# Patient Record
Sex: Female | Born: 1950 | Race: White | Hispanic: No | Marital: Married | Smoking: Former smoker
Health system: Southern US, Community
[De-identification: ages and names within clinical notes are randomized; demographics above are authoritative.]

## PROBLEM LIST (undated history)

## (undated) DIAGNOSIS — E213 Hyperparathyroidism, unspecified: Secondary | ICD-10-CM

## (undated) DIAGNOSIS — C801 Malignant (primary) neoplasm, unspecified: Secondary | ICD-10-CM

## (undated) DIAGNOSIS — M1990 Unspecified osteoarthritis, unspecified site: Secondary | ICD-10-CM

## (undated) DIAGNOSIS — J189 Pneumonia, unspecified organism: Secondary | ICD-10-CM

## (undated) DIAGNOSIS — N950 Postmenopausal bleeding: Secondary | ICD-10-CM

## (undated) DIAGNOSIS — K219 Gastro-esophageal reflux disease without esophagitis: Secondary | ICD-10-CM

## (undated) DIAGNOSIS — M75100 Unspecified rotator cuff tear or rupture of unspecified shoulder, not specified as traumatic: Secondary | ICD-10-CM

## (undated) DIAGNOSIS — D649 Anemia, unspecified: Secondary | ICD-10-CM

## (undated) DIAGNOSIS — E7800 Pure hypercholesterolemia, unspecified: Secondary | ICD-10-CM

## (undated) DIAGNOSIS — I10 Essential (primary) hypertension: Secondary | ICD-10-CM

## (undated) DIAGNOSIS — E039 Hypothyroidism, unspecified: Secondary | ICD-10-CM

## (undated) DIAGNOSIS — N183 Chronic kidney disease, stage 3 unspecified: Secondary | ICD-10-CM

## (undated) HISTORY — DX: Anemia, unspecified: D64.9

## (undated) HISTORY — DX: Unspecified osteoarthritis, unspecified site: M19.90

## (undated) HISTORY — DX: Postmenopausal bleeding: N95.0

## (undated) HISTORY — DX: Gastro-esophageal reflux disease without esophagitis: K21.9

## (undated) HISTORY — PX: TUBAL LIGATION: SHX77

## (undated) HISTORY — DX: Malignant (primary) neoplasm, unspecified: C80.1

## (undated) HISTORY — PX: OTHER SURGICAL HISTORY: SHX169

## (undated) HISTORY — DX: Unspecified rotator cuff tear or rupture of unspecified shoulder, not specified as traumatic: M75.100

## (undated) HISTORY — PX: FOOT NEUROMA SURGERY: SHX646

## (undated) HISTORY — PX: DILATION AND CURETTAGE OF UTERUS: SHX78

## (undated) HISTORY — PX: BACK SURGERY: SHX140

## (undated) HISTORY — DX: Essential (primary) hypertension: I10

## (undated) HISTORY — DX: Hypothyroidism, unspecified: E03.9

---

## 2002-11-22 HISTORY — PX: LUMBAR FUSION: SHX111

## 2004-04-10 ENCOUNTER — Encounter: Admission: RE | Admit: 2004-04-10 | Discharge: 2004-04-10 | Payer: Self-pay | Admitting: Neurosurgery

## 2004-05-14 ENCOUNTER — Inpatient Hospital Stay (HOSPITAL_COMMUNITY): Admission: RE | Admit: 2004-05-14 | Discharge: 2004-05-15 | Payer: Self-pay | Admitting: Neurosurgery

## 2004-08-12 ENCOUNTER — Encounter: Admission: RE | Admit: 2004-08-12 | Discharge: 2004-08-12 | Payer: Self-pay | Admitting: Neurosurgery

## 2005-01-05 ENCOUNTER — Ambulatory Visit: Payer: Self-pay

## 2006-02-03 ENCOUNTER — Ambulatory Visit: Payer: Self-pay | Admitting: General Practice

## 2006-03-14 ENCOUNTER — Ambulatory Visit: Payer: Self-pay | Admitting: Gastroenterology

## 2010-11-11 ENCOUNTER — Ambulatory Visit: Payer: Self-pay

## 2010-12-08 ENCOUNTER — Ambulatory Visit: Payer: Self-pay | Admitting: Internal Medicine

## 2010-12-11 ENCOUNTER — Ambulatory Visit: Payer: Self-pay | Admitting: Internal Medicine

## 2011-10-19 ENCOUNTER — Ambulatory Visit: Payer: Self-pay | Admitting: Specialist

## 2011-10-27 ENCOUNTER — Encounter: Payer: Self-pay | Admitting: Specialist

## 2011-11-23 ENCOUNTER — Encounter: Payer: Self-pay | Admitting: Specialist

## 2011-12-17 ENCOUNTER — Ambulatory Visit: Payer: Self-pay | Admitting: Gastroenterology

## 2011-12-22 LAB — PATHOLOGY REPORT

## 2011-12-23 ENCOUNTER — Ambulatory Visit: Payer: Self-pay

## 2013-12-06 ENCOUNTER — Ambulatory Visit: Payer: Self-pay | Admitting: Internal Medicine

## 2014-02-13 ENCOUNTER — Ambulatory Visit: Payer: Self-pay

## 2014-03-05 ENCOUNTER — Ambulatory Visit: Payer: Self-pay | Admitting: Obstetrics and Gynecology

## 2014-03-05 LAB — COMPREHENSIVE METABOLIC PANEL
ALBUMIN: 4 g/dL (ref 3.4–5.0)
ALK PHOS: 57 U/L
AST: 19 U/L (ref 15–37)
Anion Gap: 6 — ABNORMAL LOW (ref 7–16)
BUN: 25 mg/dL — AB (ref 7–18)
Bilirubin,Total: 0.3 mg/dL (ref 0.2–1.0)
CALCIUM: 9.5 mg/dL (ref 8.5–10.1)
CREATININE: 0.91 mg/dL (ref 0.60–1.30)
Chloride: 105 mmol/L (ref 98–107)
Co2: 29 mmol/L (ref 21–32)
EGFR (African American): >60
GLUCOSE: 92 mg/dL (ref 65–99)
OSMOLALITY: 283 (ref 275–301)
Potassium: 4 mmol/L (ref 3.5–5.1)
SGPT (ALT): 18 U/L (ref 12–78)
SODIUM: 140 mmol/L (ref 136–145)
TOTAL PROTEIN: 7.8 g/dL (ref 6.4–8.2)

## 2014-03-05 LAB — CBC
HCT: 37.3 % (ref 35.0–47.0)
HGB: 12.5 g/dL (ref 12.0–16.0)
MCH: 29.2 pg (ref 26.0–34.0)
MCHC: 33.4 g/dL (ref 32.0–36.0)
MCV: 88 fL (ref 80–100)
PLATELETS: 289 10*3/uL (ref 150–440)
RBC: 4.27 10*6/uL (ref 3.80–5.20)
RDW: 13.2 % (ref 11.5–14.5)
WBC: 7.3 10*3/uL (ref 3.6–11.0)

## 2014-03-21 ENCOUNTER — Ambulatory Visit: Payer: Self-pay | Admitting: Obstetrics and Gynecology

## 2014-03-21 HISTORY — PX: DILATION AND CURETTAGE OF UTERUS: SHX78

## 2014-03-22 LAB — PATHOLOGY REPORT

## 2015-03-15 NOTE — Op Note (Signed)
PATIENT NAME:  Stephanie Clements, Stephanie Clements MR#:  [id number] DATE OF BIRTH:  October 19, 1951  DATE OF PROCEDURE:  03/21/2014  PREOPERATIVE DIAGNOSES: 1.  Postmenopausal bleeding.  2.  Suspected endometrial/endocervical polyp.  POSTOPERATIVE DIAGNOSES: 1.  Postmenopausal bleeding.  2.  Suspected endometrial/endocervical polyp.  PROCEDURES: 1.  Dilation and curettage.  2.  Hysteroscopy.  3.  Polypectomy using MyoSure.  ANESTHESIA:  General.   SURGEON: Prentice Docker, M.D.   ESTIMATED BLOOD LOSS: 25 mL.  OPERATIVE FLUIDS: 1100 mL crystalloid.   COMPLICATIONS: None.  CALCULATED FLUID DEFICIT: 120 mL.   FINDINGS: 1.  Broad-based polypoid lesion located in anterior lower uterine segment/proximal endocervix.  2.  Otherwise normal-appearing uterine cavity with atrophic-appearing endometrium.   SPECIMENS: 1.  Endometrial curetting.  2.  Fragments of polyp.  CONDITION AT END OF PROCEDURE: Stable.   PROCEDURE IN DETAIL: The patient was taken to the operating room where general anesthesia was administered and found to be adequate. She was placed in the dorsal supine high-lithotomy position in candy cane stirrups and prepped and draped in the usual sterile fashion. After timeout was called, a red rubber catheter was placed through the urethra into the bladder for an in-and-out catheterization. About 100 mL of clear urine was returned. The catheter was removed. A sterile speculum was placed in the vagina and a single-tooth tenaculum was used to grasp the anterior lip of the cervix. The cervix was dilated gently in a serial fashion using Hegar dilators to a dilatation of 6 mm. The MyoSure hysteroscope was then gently introduced through the cervix into the uterine cavity and endocervix with the above-noted findings. The medium MyoSure device was then utilized to remove the polypoid lesion in its entirety. The MyoSure device was removed and a gentle curettage was performed to take a general global sample of  the uterine lining. The hysteroscope was then gently reintroduced with no noted damage or defect to the uterus. At this point, the hysteroscopic portion of the procedure was terminated. There was a little bit of oozing from the distal cervix which is ultimately cauterized using silver nitrate and at the end of the procedure there was hemostasis. The single-tooth tenaculum was removed from the cervix and hemostasis was obtained. The speculum was removed from the vagina and all other instrumentation was assured to be removed from the vagina.   The patient tolerated the procedure well. Sponge, lap and needle counts were correct x 2. For VTE prophylaxis, she was wearing pneumatic compression stockings which were on and operating throughout the entire procedure. She was awakened in the operating room and taken to the recovery area in stable condition.   ____________________________ Will Bonnet, MD sdj:ce D: 03/21/2014 13:36:06 ET T: 03/21/2014 13:46:55 ET JOB#: [id number]  cc: Will Bonnet, MD, <Dictator> Will Bonnet MD ELECTRONICALLY SIGNED 04/02/2014 4:22

## 2019-07-13 ENCOUNTER — Other Ambulatory Visit: Payer: Self-pay | Admitting: Neurosurgery

## 2019-07-13 DIAGNOSIS — M5416 Radiculopathy, lumbar region: Secondary | ICD-10-CM

## 2022-04-30 HISTORY — PX: COLONOSCOPY: SHX174

## 2022-11-08 ENCOUNTER — Ambulatory Visit
Admission: RE | Admit: 2022-11-08 | Discharge: 2022-11-08 | Disposition: A | Discharge status: Home / Self Care | Payer: Self-pay | Source: Ambulatory Visit | Attending: Orthopedic Surgery | Admitting: Orthopedic Surgery

## 2022-11-08 ENCOUNTER — Other Ambulatory Visit: Payer: Self-pay

## 2022-11-08 DIAGNOSIS — Z049 Encounter for examination and observation for unspecified reason: Secondary | ICD-10-CM

## 2022-11-08 NOTE — Progress Notes (Unsigned)
Referring Physician:  No referring provider defined for this encounter.  Primary Physician:  No primary care provider on file.  History of Present Illness: 11/08/2022*** [name] has a history of basal cell CA, GERD, HTN, hypothyroidism.   She last had a telephone encounter with Dr. Izora Ribas on 02/11/22. She was doing much better with PT and she was to continue her exercises.   History of lumbar fusion L3-L5 in 2004. 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   She is here for follow up.        Duration: *** Location: *** Quality: *** Severity: ***  Precipitating: aggravated by *** Modifying factors: made better by *** Weakness: none Timing: *** Bowel/Bladder Dysfunction: none  Conservative measures:  Physical therapy: has tried in May 2019 Multimodal medical therapy including regular antiinflammatories: norco, valium, decadron IV, flexeril, mobic, tylenol, gabapentin Injections: has tried a facet injection about 2 weeks ago - moderate relief; has not tried epidural steroid injections  Past Surgery: L3-L5 Fusion in 2004   Stephanie Clements has ***no symptoms of cervical myelopathy.  The symptoms are causing a significant impact on the patient's life.   Review of Systems:  A 10 point review of systems is negative, except for the pertinent positives and negatives detailed in the HPI.  Past Medical History: No past medical history on file.  Past Surgical History: *** The histories are not reviewed yet. Please review them in the "History" navigator section and refresh this [place].  Allergies: Allergies as of 11/09/2022   (Not on File)    Medications: No outpatient encounter medications on file as of 11/09/2022.   No facility-administered encounter medications on file as of 11/09/2022.    Social  History:    Family Medical History: No family history on file.  Physical Examination: There were no vitals filed for this visit.  General: Patient is well developed, well nourished, calm, collected, and in no apparent distress. Attention to examination is appropriate.  Respiratory: Patient is breathing without any difficulty.   NEUROLOGICAL:     Awake, alert, oriented to person, place, and time.  Speech is clear and fluent. Fund of knowledge is appropriate.   Cranial Nerves: Pupils equal round and reactive to light.  Facial tone is symmetric.  Facial sensation is symmetric.  ROM of spine:  *** ROM of cervical spine *** pain *** ROM of lumbar spine *** pain  No abnormal lesions on exposed skin.   Strength: Side Biceps Triceps Deltoid Interossei Grip Wrist Ext. Wrist Flex.  R '5 5 5 5 5 5 5  '$ L '5 5 5 5 5 5 5   '$ Side Iliopsoas Quads Hamstring PF DF EHL  R '5 5 5 5 5 5  '$ L '5 5 5 5 5 5   '$ Reflexes are ***2+ and symmetric at the biceps, triceps, brachioradialis, patella and achilles.   Hoffman's is absent.  Clonus is not present.   Bilateral upper and lower extremity sensation is intact to light touch.    No evidence of dysmetria noted.  Gait is normal.   ***No difficulty with tandem gait.    Medical Decision Making  Imaging: No recent imaging to review. Last lumbar MRI/CT scan were on 12/11/21.   I have personally reviewed the images and agree with the above interpretation.  Assessment and Plan: [name] is a pleasant 71  y.o. female with ***  Treatment options discussed with patient and following plan made:   If she worsens in the future, there are several rational surgical approaches. We have discussed the possibility of extending only to L5-S1 versus L2-S2. Another consideration would be left L5-S1 microdiscectomy to try to prevent additional levels of fusion. This would depend on the level of her back pain at the time of consideration. ***   - Order for physical  therapy for *** spine ***. - Continue on current medications including ***. Reviewed proper dosing along with risks and benefits. Take and NSAIDs with food.      I spent a total of *** minutes in face-to-face and non-face-to-face activities related to this patient's care today including review of outside records, review of imaging, review of symptoms, physical exam, discussion of differential diagnosis, discussion of treatment options, and documentation.   Thank you for involving me in the care of this patient.   Geronimo Boot PA-C Dept. of Neurosurgery

## 2022-11-09 ENCOUNTER — Ambulatory Visit (INDEPENDENT_AMBULATORY_CARE_PROVIDER_SITE_OTHER): Payer: Medicare Other | Admitting: Orthopedic Surgery

## 2022-11-09 ENCOUNTER — Encounter: Payer: Self-pay | Admitting: Orthopedic Surgery

## 2022-11-09 VITALS — BP 124/74 | Ht 65.0 in | Wt 195.8 lb

## 2022-11-09 DIAGNOSIS — M4726 Other spondylosis with radiculopathy, lumbar region: Secondary | ICD-10-CM | POA: Diagnosis not present

## 2022-11-09 DIAGNOSIS — M5136 Other intervertebral disc degeneration, lumbar region: Secondary | ICD-10-CM | POA: Diagnosis not present

## 2022-11-09 DIAGNOSIS — Z981 Arthrodesis status: Secondary | ICD-10-CM

## 2022-11-09 DIAGNOSIS — Z09 Encounter for follow-up examination after completed treatment for conditions other than malignant neoplasm: Secondary | ICD-10-CM | POA: Diagnosis not present

## 2022-11-09 DIAGNOSIS — M5416 Radiculopathy, lumbar region: Secondary | ICD-10-CM

## 2022-11-09 DIAGNOSIS — M47816 Spondylosis without myelopathy or radiculopathy, lumbar region: Secondary | ICD-10-CM

## 2022-11-09 MED ORDER — METHYLPREDNISOLONE 4 MG PO TBPK: ORAL_TABLET | ORAL | 0 refills | Status: DC

## 2022-11-09 NOTE — Patient Instructions (Signed)
It was so nice to see you today, I am sorry that you are hurting so much.   We know that you have wear and tear above and below your fusion. Pain is likely from this and got worse with the baking/bending.   I sent a prescription for a steroid pack to help with pain and inflammation. Take as directed. Stop the meloxicam while you are taking the steroids. You can restart the meloxicam once you finish the steroids.   You can continue on the hydrocodone as needed. Take only for severe pain. Be careful, this can make you sleepy and/or constipated.   I will message you later this week to check on you. If pain does not improve, we can consider repeat steroid injections and/or revisiting physical therapy.   I enjoyed talking to you so much- you made my day!  Please do not hesitate to call if you have any questions or concerns. You can also message me in [place].   Geronimo Boot PA-C [phone]

## 2022-11-18 ENCOUNTER — Other Ambulatory Visit: Payer: Self-pay | Admitting: Neurosurgery

## 2022-11-18 DIAGNOSIS — M47816 Spondylosis without myelopathy or radiculopathy, lumbar region: Secondary | ICD-10-CM

## 2022-11-18 DIAGNOSIS — M5416 Radiculopathy, lumbar region: Secondary | ICD-10-CM

## 2022-11-18 DIAGNOSIS — M5136 Other intervertebral disc degeneration, lumbar region: Secondary | ICD-10-CM

## 2022-11-18 MED ORDER — GABAPENTIN 100 MG PO CAPS: 100.0000 mg | ORAL_CAPSULE | Freq: Three times a day (TID) | ORAL | 2 refills | Status: DC

## 2022-11-18 NOTE — Progress Notes (Signed)
Sent Gabapentin in per patients request

## 2022-11-18 NOTE — Telephone Encounter (Signed)
Patient is calling that she needs something to help her make it through until she can get injections but to add to this her sister passed away and now she needs to drive to the funeral and she knows the car ride will increased her pain. Can you call her in medication to   [facility] [place] before 3pm today. They want to leave their house no later than 5pm today.

## 2022-11-23 DIAGNOSIS — Z981 Arthrodesis status: Secondary | ICD-10-CM

## 2022-11-23 DIAGNOSIS — M47816 Spondylosis without myelopathy or radiculopathy, lumbar region: Secondary | ICD-10-CM

## 2022-11-23 DIAGNOSIS — M5136 Other intervertebral disc degeneration, lumbar region: Secondary | ICD-10-CM

## 2022-11-23 DIAGNOSIS — M5416 Radiculopathy, lumbar region: Secondary | ICD-10-CM

## 2022-11-24 MED ORDER — BACLOFEN 10 MG PO TABS: 10.0000 mg | ORAL_TABLET | Freq: Two times a day (BID) | ORAL | 0 refills | Status: DC | PRN

## 2022-11-24 NOTE — Addendum Note (Signed)
Addended byGeronimo Boot on: 11/24/2022 03:11 PM   Modules accepted: Orders

## 2022-11-24 NOTE — Telephone Encounter (Signed)
Patient is calling that she needs medication to get some relief. During the day the pain is tolerable, but at night she is only sleeping in 1 hour increments. She can not longer live like this. She has been in pain since December 15. She is asking to speak directly to [facility] [phone].

## 2022-11-24 NOTE — Telephone Encounter (Signed)
Please schedule her a f/u with Izora Ribas in 8 weeks. She comes from [place] so an afternoon appointment is probably better.   Thanks!

## 2022-11-24 NOTE — Telephone Encounter (Signed)
She is having severe pain at night and can only sleep for an hour at a time. She has to get up and walk around for 20 minutes and then she can maybe sleep for another hour.   She has norco 5 to take (was given #120 from PCP on 12/18). She has not been taking this since she started the neurontin.   We discussed that neurontin would take some time (2 weeks) to build up in her system. I think she would see more short term relief with the hydrocodone (she does not want to take them together).   She took neurontin '100mg'$  this  morning and one at lunch. She will take '100mg'$  tomorrow am and then stop it.   She will take her norco as prescribed and as needed. She has seen relief with baclofen in the past. I will send a refill of this to her pharmacy.   We discussed taking OTC senna daily and not going too many days without BM when on norco.   I will mail her a prescription to start PT and we will make her a f/u with Dr. Izora Ribas in 8 weeks. She is ready to discuss surgery.   She is waiting on call back for ESI at [facility].

## 2022-11-24 NOTE — Telephone Encounter (Signed)
Appt scheduled for 01/27/23 at 2:45pm, letter mailed with physical therapy referral.

## 2022-11-24 NOTE — Addendum Note (Signed)
Addended byGeronimo Boot on: 11/24/2022 03:21 PM   Modules accepted: Orders

## 2022-11-26 NOTE — Telephone Encounter (Signed)
I left message on voice mail for [facility] waiting on their call.

## 2022-12-07 ENCOUNTER — Telehealth: Payer: Self-pay

## 2022-12-07 NOTE — Telephone Encounter (Signed)
-----  Message from Wylie sent at 12/07/2022  8:29 AM EST ----- Regarding: Pain and PT Just got off the phone with Ms. Peeters. She has originally wanted to move her appt up due to pain. After my conversation with both Hanadi Stanly and Dr. Darreld Mclean, it was decided she will keep her appt at the end of February to accommodate for PT but changed to a New Patient appt.  She wanted it noted that she had the injections one week ago exactly and the first 24 hours were great but that since then she has been a ton of pain. She also wanted it noted that aside from the date of the injection the pain has made it unbearable to sleep ( she says roughly 3 hours a night) also she has been taking  HYDROcodone-Acetaminophen (Tab) NORCO/VICODIN 5-325 MG With instructions to Take 1 tablet by mouth every 6 (six) hours as needed however she has been doubling the dose to try and ease the pain with little to no result.

## 2022-12-07 NOTE — Telephone Encounter (Signed)
I agree with below

## 2022-12-07 NOTE — Telephone Encounter (Signed)
She starts PT today. Cristin told her if PT discharges her sooner, we may be able to move her appt with Dr Izora Ribas to a sooner date.

## 2023-01-05 NOTE — Progress Notes (Unsigned)
Referring Physician:  Geronimo Boot, PA-C [address],  [place] [id number]  Primary Physician:  Santo Held, DO  History of Present Illness: 01/06/2023 Stephanie Clements is here today with a chief complaint of  low back pain that radiates into the posterior right leg that stops at her ankle. She also has numbness in the left leg that is chronic.  She has had intermittent pain down the right greater than left leg for several months.  She has had multiple different exacerbations of pain.  Her pain is made worse by laying down or by walking a significant distance.  She used to walk 1 mile a day and is unable to do so now due to pain.  Changing positions helps.  Her pain can be very severe when it hits her.   Bowel/Bladder Dysfunction: none  Conservative measures:  Physical therapy: currently participating at [facility] since 12/07/22 Multimodal medical therapy including regular antiinflammatories:   norco, valium, decadron IV, flexeril, mobic, tylenol, gabapentin  Injections:  has received epidural steroid injections 01/07/22: Left L5-S1 and Left S1 ESI (Dr. Tamala Julian) 11/30/22: Right L5-S1 and Right S1 ESI (Dr. Sharlet Salina)  Past Surgery:  L3-5 fusion in 2004  Stephanie Clements has no symptoms of cervical myelopathy.  The symptoms are causing a significant impact on the patient's life.    Progress Note from Geronimo Boot, [place] on 11/09/22:  History of Present Illness: 11/09/2022 [name] has a history of basal cell CA, GERD, HTN, hypothyroidism.    She last had a telephone encounter with Dr. Izora Ribas on 02/11/22. She was doing much better with PT and she was to continue her exercises.    History of lumbar fusion L3-L5 in 2004. 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    She is here for follow up.     She has constant LBP with constant posterior right leg pain to her knee that started 4 days ago. She was baking and doing a lot of bending. No left leg pain- she has chronic numbness in this leg. Pain is worse at night, improves when she is able to change positions. She is having trouble sleeping at night due to pain.    She is taking prn norco. She is taking mobic as well.    Bowel/Bladder Dysfunction: none   Previous flare up of pain about a year ago was on left side. This improved with lumbar ESI. She has chronic LBP since her surgery and has chronic numbness in left leg.   I have utilized the care everywhere function in epic to review the outside records available from external health systems.  Review of Systems:  A 10 point review of systems is negative, except for the pertinent positives and negatives detailed in the HPI.  Past Medical History: Past Medical History:  Diagnosis Date   Anemia    Arthritis    Cancer ([place])    basal cell   GERD (gastroesophageal reflux disease)    Hypertension    Hypothyroidism    PMB (postmenopausal bleeding)    Torn rotator cuff    bilateral    Past Surgical History: Past Surgical History:  Procedure Laterality Date   aspiration biopsy procedure     BACK SURGERY     breast biopsy (negative)     CESAREAN SECTION  DILATION AND CURETTAGE OF UTERUS      Allergies: Allergies as of 01/06/2023 - Review Complete 01/06/2023  Allergen Reaction Noted   Pollen extract Itching 10/05/2018   Gabapentin Other (See Comments) 09/19/2018   Cyclobenzaprine Other (See Comments) 09/24/2019   Poison ivy extract Itching 07/29/2015   Poison oak extract Itching 09/08/2017    Medications: Current Meds  Medication Sig   acetaminophen (TYLENOL) 325 MG tablet Take 650 mg by mouth as needed.   Cholecalciferol (VITAMIN D-1000 MAX ST) 25 MCG (1000 UT) tablet Take 2,000 Units by mouth daily.   cyanocobalamin 100 MCG tablet Take 100 mcg by mouth daily.    HYDROcodone-acetaminophen (NORCO/VICODIN) 5-325 MG tablet Take 1 tablet by mouth every 6 (six) hours as needed.   levothyroxine (SYNTHROID) 75 MCG tablet Take 75 mcg by mouth daily before breakfast.   loratadine (CLARITIN) 10 MG tablet Take 10 mg by mouth daily.   MAGNESIUM PO Take 1 tablet by mouth daily.   rosuvastatin (CRESTOR) 10 MG tablet Take 1 tablet by mouth daily.   triamterene-hydrochlorothiazide (MAXZIDE-25) 37.5-25 MG tablet Take 1 tablet by mouth daily.   [DISCONTINUED] losartan (COZAAR) 100 MG tablet Take 50 mg by mouth daily.   [DISCONTINUED] meloxicam (MOBIC) 7.5 MG tablet Take 2 tablets by mouth daily.   [DISCONTINUED] zolpidem (AMBIEN) 5 MG tablet Take 5 mg by mouth at bedtime as needed.    Social History: Social History   Tobacco Use   Smoking status: Former    Types: Cigarettes   Smokeless tobacco: Never    Family Medical History: No family history on file.  Physical Examination: Vitals:   01/06/23 1315  BP: 130/78    General: Patient is well developed, well nourished, calm, collected, and in no apparent distress. Attention to examination is appropriate.  Neck:   Supple.  Full range of motion.  Respiratory: Patient is breathing without any difficulty.   NEUROLOGICAL:     Awake, alert, oriented to person, place, and time.  Speech is clear and fluent.   Cranial Nerves: Pupils equal round and reactive to light.  Facial tone is symmetric.  Facial sensation is symmetric. Shoulder shrug is symmetric. Tongue protrusion is midline.  There is no pronator drift.  ROM of spine: full.    Strength: Side Biceps Triceps Deltoid Interossei Grip Wrist Ext. Wrist Flex.  R 5 5 5 5 5 5 5  $ L 5 5 5 5 5 5 5   $ Side Iliopsoas Quads Hamstring PF DF EHL  R 5 5 5 5 5 5  $ L 5 5 5 5 5 5   $ Reflexes are 1+ and symmetric at the biceps, triceps, brachioradialis, patella and achilles.   Hoffman's is absent.   Bilateral upper and lower extremity sensation is intact to light  touch.    No evidence of dysmetria noted.  Gait is antalgic.     Medical Decision Making  Imaging: MRI L spine 12/11/2021 IMPRESSION:  1. Prior posterior spinal fusion and laminectomies from L3 through L5.  2.  Worsening adjacent segment disease at the L5-S1 level with increased  narrowing of the left greater than right lateral recesses, which may affect  the descending bilateral S1 nerve roots.  3.  Additional degenerative changes as described above.    Electronically Signed by:  Earney Mallet, MD, [facility]  Electronically Signed on:  12/11/2021 8:35 PM   I have personally reviewed the images and agree with the above interpretation.  Assessment and Plan: Ms. Krupnick is a  pleasant 72 y.o. 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  She previously had a significant disc herniation at L5-S1.  Her imaging is out of date.  Will obtain new imaging and then make a final plan of L2-S2 intervention versus extension of fusion to L5-S1.  I spent a total of 30 minutes in this patient's care today. This time was spent reviewing pertinent records including imaging studies, obtaining and confirming history, performing a directed evaluation, formulating and discussing my recommendations, and documenting the visit within the medical record.    Thank you for involving me in the care of this patient.      Harbor Paster K. Izora Ribas MD, [facility] Neurosurgery

## 2023-01-06 ENCOUNTER — Ambulatory Visit (INDEPENDENT_AMBULATORY_CARE_PROVIDER_SITE_OTHER): Payer: Medicare Other | Admitting: Neurosurgery

## 2023-01-06 ENCOUNTER — Encounter: Payer: Self-pay | Admitting: Neurosurgery

## 2023-01-06 VITALS — BP 130/78 | Ht 65.0 in | Wt 196.6 lb

## 2023-01-06 DIAGNOSIS — M5136 Other intervertebral disc degeneration, lumbar region: Secondary | ICD-10-CM | POA: Diagnosis not present

## 2023-01-06 DIAGNOSIS — M4316 Spondylolisthesis, lumbar region: Secondary | ICD-10-CM

## 2023-01-11 ENCOUNTER — Ambulatory Visit: Payer: Medicare Other | Admitting: Neurosurgery

## 2023-01-21 ENCOUNTER — Telehealth: Payer: Self-pay

## 2023-01-21 ENCOUNTER — Ambulatory Visit
Admission: RE | Admit: 2023-01-21 | Discharge: 2023-01-21 | Disposition: A | Discharge status: Home / Self Care | Payer: Self-pay | Source: Ambulatory Visit | Attending: Neurosurgery | Admitting: Neurosurgery

## 2023-01-21 ENCOUNTER — Other Ambulatory Visit: Payer: Self-pay

## 2023-01-21 DIAGNOSIS — Z049 Encounter for examination and observation for unspecified reason: Secondary | ICD-10-CM

## 2023-01-21 NOTE — Telephone Encounter (Addendum)
We never received a copy of the report, but it is in [place]. [employer] request sent.

## 2023-01-21 NOTE — Telephone Encounter (Signed)
It looks like Dr Darreld Mclean has an open slot next Tuesday if you want to offer it to her

## 2023-01-21 NOTE — Telephone Encounter (Signed)
Patient confirmed telephone visit for 01/25/2023.

## 2023-01-21 NOTE — Telephone Encounter (Signed)
Images received.

## 2023-01-21 NOTE — Telephone Encounter (Signed)
-----   Message from Peggyann Shoals sent at 01/21/2023 11:42 AM EST ----- Regarding: MRI results Contact: [phone] She had her MRI on 2/26, she is just wanting to know if Dr.Yarbrough wants to set up a telephone visit?

## 2023-01-25 ENCOUNTER — Encounter: Payer: Self-pay | Admitting: Neurosurgery

## 2023-01-25 ENCOUNTER — Ambulatory Visit (INDEPENDENT_AMBULATORY_CARE_PROVIDER_SITE_OTHER): Payer: Medicare Other | Admitting: Neurosurgery

## 2023-01-25 DIAGNOSIS — M5136 Other intervertebral disc degeneration, lumbar region: Secondary | ICD-10-CM

## 2023-01-25 DIAGNOSIS — M4316 Spondylolisthesis, lumbar region: Secondary | ICD-10-CM

## 2023-01-25 NOTE — Progress Notes (Signed)
Referring Physician:  No referring provider defined for this encounter.  Primary Physician:  Santo Held, DO  History of Present Illness: 01/25/2023 [name] presents today to discuss her imaging findings.  Her right leg pain has improved since I last saw her.  Her left leg pain continues.  01/06/2023 [name] is here today with a chief complaint of  low back pain that radiates into the posterior right leg that stops at her ankle. She also has numbness in the left leg that is chronic.  She has had intermittent pain down the right greater than left leg for several months.  She has had multiple different exacerbations of pain.  Her pain is made worse by laying down or by walking a significant distance.  She used to walk 1 mile a day and is unable to do so now due to pain.  Changing positions helps.  Her pain can be very severe when it hits her.   Bowel/Bladder Dysfunction: none  Conservative measures:  Physical therapy: currently participating at [facility] since 12/07/22 Multimodal medical therapy including regular antiinflammatories:   norco, valium, decadron IV, flexeril, mobic, tylenol, gabapentin  Injections:  has received epidural steroid injections 01/07/22: Left L5-S1 and Left S1 ESI (Dr. Tamala Julian) 11/30/22: Right L5-S1 and Right S1 ESI (Dr. Sharlet Salina)  Past Surgery:  L3-5 fusion in 2004  Stephanie Clements has no symptoms of cervical myelopathy.  The symptoms are causing a significant impact on the patient's life.    Progress Note from [facility], [place] on 11/09/22:  History of Present Illness: 11/09/2022 [name] has a history of basal cell CA, GERD, HTN, hypothyroidism.    She last had a telephone encounter with Dr. Izora Ribas on 02/11/22. She was doing much better with PT and she was to continue her exercises.    History of lumbar fusion L3-L5 in 2004. 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    She is here for follow up.    She has constant LBP with constant posterior right leg pain to her knee that started 4 days ago. She was baking and doing a lot of bending. No left leg pain- she has chronic numbness in this leg. Pain is worse at night, improves when she is able to change positions. She is having trouble sleeping at night due to pain.    She is taking prn norco. She is taking mobic as well.    Bowel/Bladder Dysfunction: none   Previous flare up of pain about a year ago was on left side. This improved with lumbar ESI. She has chronic LBP since her surgery and has chronic numbness in left leg.   I have utilized the care everywhere function in epic to review the outside records available from external health systems.  Review of Systems:  A 10 point review of systems is negative, except for the pertinent positives and negatives detailed in the HPI.  Past Medical History: Past Medical History:  Diagnosis Date   Anemia    Arthritis    Cancer ([place])    basal cell   GERD (gastroesophageal reflux disease)    Hypertension    Hypothyroidism    PMB (postmenopausal bleeding)    Torn rotator cuff    bilateral    Past Surgical History: Past Surgical History:  Procedure Laterality Date   aspiration  biopsy procedure     BACK SURGERY     breast biopsy (negative)     CESAREAN SECTION     DILATION AND CURETTAGE OF UTERUS      Allergies: Allergies as of 01/25/2023 - Review Complete 01/06/2023  Allergen Reaction Noted   Pollen extract Itching 10/05/2018   Gabapentin Other (See Comments) 09/19/2018   Cyclobenzaprine Other (See Comments) 09/24/2019   Poison ivy extract Itching 07/29/2015   Poison oak extract Itching 09/08/2017    Medications: No outpatient medications have been marked as taking for the 01/25/23 encounter (Appointment) with Meade Maw, MD.     Social History: Social History   Tobacco Use   Smoking status: Former    Types: Cigarettes   Smokeless tobacco: Never    Family Medical History: No family history on file.  Physical Examination: There were no vitals filed for this visit. Telephone visit today   Medical Decision Making  Imaging: MRI L spine 12/11/2021 IMPRESSION:  1. Prior posterior spinal fusion and laminectomies from L3 through L5.  2.  Worsening adjacent segment disease at the L5-S1 level with increased  narrowing of the left greater than right lateral recesses, which may affect  the descending bilateral S1 nerve roots.  3.  Additional degenerative changes as described above.    Electronically Signed by:  Earney Mallet, MD, [facility]  Electronically Signed on:  12/11/2021 8:35 PM   MRI L spine 01/17/2023 FINDINGS:   Conus terminates at T12. Fusion hardware L3-L5.   L1-L2: Unremarkable   L2-L3: Severe degenerative disc disease. There is a disc  osteophyte complex with moderate bilateral degenerative facet  disease causing mild spinal canal narrowing. Mild bilateral  foraminal narrowing   L3-L4: Right laminectomy. No mass effect on the thecal sac or  foraminal stenosis   L4-L5: No mass effect on the thecal sac or foraminal narrowing   L5-S1: Severe degenerative disc disease. There is a disc  osteophyte complex and a left paracentral disc protrusion causing  mass effect upon the left S1 nerve root origin in the lateral  recess. Severe bilateral degenerative facet disease. Overall  moderate spinal canal narrowing. Mild right foraminal stenosis.   Paraspinal soft tissues are unremarkable   I have personally reviewed the images and agree with the above interpretation.  Assessment and Plan: Stephanie Clements is a pleasant 72 y.o. 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  She has tried and failed conservative management.  At this point, I recommended L3-S1 intervention with  L5-S1 transforaminal lumbar interbody fusion from the left.  She would like to think through this.  We discussed the risks including lack of improvement, medical complications, and need for additional surgery.  She expressed understanding of these risks.  She will call back when she is ready to move forward.    This visit was performed via telephone.  Patient location: home Provider location: office  I spent a total of 15 minutes non-face-to-face activities for this visit on the date of this encounter including review of current clinical condition and response to treatment.  The patient is aware of and accepts the limits of this telehealth visit.      Savio Albrecht K. Izora Ribas MD, [facility]

## 2023-01-26 ENCOUNTER — Encounter: Payer: Self-pay | Admitting: Neurosurgery

## 2023-01-27 ENCOUNTER — Ambulatory Visit: Payer: Medicare Other | Admitting: Neurosurgery

## 2023-02-01 NOTE — Telephone Encounter (Signed)
Patient is currently on the Atkins diet. Should she stop her diet? She does not want her BUN/CREATINE labs to be affected by her diet.  She has questions about recovery and work. Her employer is asking if she will be completely out of work. She was planning to take a week off but if she needs more time she can take it. She just wants to know exactly how much time she should ask for. She works 5 days a week for 4 hours a day from home. She can work 1 hour if needed.   She would like to go ahead a get a new PT order so she can get on their schedule.  Can the order be mailed to her.  Call her whenever you have time.

## 2023-02-03 ENCOUNTER — Telehealth: Payer: Self-pay

## 2023-02-03 ENCOUNTER — Other Ambulatory Visit: Payer: Self-pay

## 2023-02-03 DIAGNOSIS — Z01818 Encounter for other preprocedural examination: Secondary | ICD-10-CM

## 2023-02-03 NOTE — Telephone Encounter (Signed)
Stephanie Clements has requested to have a phone interview for her PAT appointment and have her pre-op labs done through her PCP (she doesn't live locally). Discussed with Dr Izora Ribas and he said he is OK with this.  I have placed the following orders to send to her PCP Dr Barrie Dunker: CBC BMP Staph/mrsa PCR nasal swab Urinalysis EKG   Bryan, I assume she will get a type and screen the morning of surgery. If there is anything else you feel she needs, please let me know.

## 2023-02-04 NOTE — Telephone Encounter (Signed)
Faxed orders to Dr Dale Zapata office. Asked patient to let us know when she has completed these tests.

## 2023-02-09 NOTE — Telephone Encounter (Signed)
Appt with Dr Barrie Dunker for pre-op labs on 02/15/23

## 2023-02-17 ENCOUNTER — Inpatient Hospital Stay: Admission: RE | Admit: 2023-02-17 | Payer: Medicare Other | Source: Ambulatory Visit

## 2023-02-17 NOTE — Telephone Encounter (Signed)
EKG scanned. Still waiting on MRSA swab results

## 2023-02-18 ENCOUNTER — Encounter
Admission: RE | Admit: 2023-02-18 | Discharge: 2023-02-18 | Disposition: A | Discharge status: Home / Self Care | Payer: Medicare Other | Source: Ambulatory Visit | Attending: Neurosurgery | Admitting: Neurosurgery

## 2023-02-18 HISTORY — DX: Chronic kidney disease, stage 3 unspecified: N18.30

## 2023-02-18 HISTORY — DX: Pure hypercholesterolemia, unspecified: E78.00

## 2023-02-18 HISTORY — DX: Hyperparathyroidism, unspecified: E21.3

## 2023-02-18 HISTORY — DX: Pneumonia, unspecified organism: J18.9

## 2023-02-18 NOTE — Patient Instructions (Addendum)
Your procedure is scheduled on: Monday, April 8 Report to the Registration Desk on the 1st floor of the [facility]. To find out your arrival time, please call [phone] between 1PM - 3PM on: Friday, April 5 If your arrival time is 6:00 am, do not arrive before that time as the [place] entrance doors do not open until 6:00 am.  REMEMBER: Instructions that are not followed completely may result in serious medical risk, up to and including death; or upon the discretion of your surgeon and anesthesiologist your surgery may need to be rescheduled.  Do not eat food after midnight the night before surgery.  No gum chewing or hard candies.  You may however, drink CLEAR liquids up to 2 hours before you are scheduled to arrive for your surgery. Do not drink anything within 2 hours of your scheduled arrival time.  Clear liquids include: - water  - apple juice without pulp - gatorade (not RED colors) - black coffee or tea (Do NOT add milk or creamers to the coffee or tea) Do NOT drink anything that is not on this list.  One week prior to surgery: starting April 1 Stop meloxicam and Anti-inflammatories (NSAIDS) such as Advil, Aleve, Ibuprofen, Motrin, Naproxen, Naprosyn and Aspirin based products such as Excedrin, Goody's Powder, BC Powder. Stop ANY OVER THE COUNTER supplements until after surgery. Stop vitamin D, vitamin B, magnesium You may however, continue to take Tylenol if needed for pain up until the day of surgery.  Continue taking all prescribed medications   TAKE ONLY THESE MEDICATIONS THE MORNING OF SURGERY WITH A SIP OF WATER:  Levothyroxine Rosuvastatin (Crestor) Hydrocodone if needed for pain  No Alcohol for 24 hours before or after surgery.  No Smoking including e-cigarettes for 24 hours before surgery.  No chewable tobacco products for at least 6 hours before surgery.  No nicotine patches on the day of surgery.  Do not use any "recreational" drugs for at least a  week (preferably 2 weeks) before your surgery.  Please be advised that the combination of cocaine and anesthesia may have negative outcomes, up to and including death. If you test positive for cocaine, your surgery will be cancelled.  On the morning of surgery brush your teeth with toothpaste and water, you may rinse your mouth with mouthwash if you wish. Do not swallow any toothpaste or mouthwash.  Use CHG Soap as directed on instruction sheet. If unable to get this soap, use liquid antibacterial soap (Dial) to shower with before coming to the hospital.  Do not wear jewelry, make-up, hairpins, clips or nail polish.  Do not wear lotions, powders, or perfumes.   Do not shave body hair from the neck down 48 hours before surgery.  Contact lenses, hearing aids and dentures may not be worn into surgery.  Do not bring valuables to the hospital. [facility] is not responsible for any missing/lost belongings or valuables.   Notify your doctor if there is any change in your medical condition (cold, fever, infection).  Wear comfortable clothing (specific to your surgery type) to the hospital.  After surgery, you can help prevent lung complications by doing breathing exercises.  Take deep breaths and cough every 1-2 hours. Your doctor may order a device called an Incentive Spirometer to help you take deep breaths.  If you are being admitted to the hospital overnight, leave your suitcase in the car. After surgery it may be brought to your room.  In case of increased patient  census, it may be necessary for you, the patient, to continue your postoperative care in the Same Day Surgery department.  If you are being discharged the day of surgery, you will not be allowed to drive home. You will need a responsible individual to drive you home and stay with you for 24 hours after surgery.   If you are taking public transportation, you will need to have a responsible individual with you.  Please call  the [facility] Dept. at [phone] if you have any questions about these instructions.  Surgery Visitation Policy:  Patients having surgery or a procedure may have two visitors.  Children under the age of 30 must have an adult with them who is not the patient.  Inpatient Visitation:    Visiting hours are 7 a.m. to 8 p.m. Up to four visitors are allowed at one time in a patient room. The visitors may rotate out with other people during the day.  One visitor age 70 or older may stay with the patient overnight and must be in the room by 8 p.m.     Preparing for Surgery with CHLORHEXIDINE GLUCONATE (CHG) Soap  Chlorhexidine Gluconate (CHG) Soap  o An antiseptic cleaner that kills germs and bonds with the skin to continue killing germs even after washing  o Used for showering the night before surgery and morning of surgery  Before surgery, you can play an important role by reducing the number of germs on your skin.  CHG (Chlorhexidine gluconate) soap is an antiseptic cleanser which kills germs and bonds with the skin to continue killing germs even after washing.  Please do not use if you have an allergy to CHG or antibacterial soaps. If your skin becomes reddened/irritated stop using the CHG.  1. Shower the NIGHT BEFORE SURGERY and the MORNING OF SURGERY with CHG soap.  2. If you choose to wash your hair, wash your hair first as usual with your normal shampoo.  3. After shampooing, rinse your hair and body thoroughly to remove the shampoo.  4. Use CHG as you would any other liquid soap. You can apply CHG directly to the skin and wash gently with a scrungie or a clean washcloth.  5. Apply the CHG soap to your body only from the neck down. Do not use on open wounds or open sores. Avoid contact with your eyes, ears, mouth, and genitals (private parts). Wash face and genitals (private parts) with your normal soap.  6. Wash thoroughly, paying special attention to the area  where your surgery will be performed.  7. Thoroughly rinse your body with warm water.  8. Do not shower/wash with your normal soap after using and rinsing off the CHG soap.  9. Pat yourself dry with a clean towel.  10. Wear clean pajamas to bed the night before surgery.  12. Place clean sheets on your bed the night of your first shower and do not sleep with pets.  13. Shower again with the CHG soap on the day of surgery prior to arriving at the hospital.  14. Do not apply any deodorants/lotions/powders.  15. Please wear clean clothes to the hospital.

## 2023-02-28 ENCOUNTER — Inpatient Hospital Stay: Payer: Medicare Other

## 2023-02-28 ENCOUNTER — Other Ambulatory Visit: Payer: Self-pay

## 2023-02-28 ENCOUNTER — Inpatient Hospital Stay: Payer: Medicare Other | Admitting: Urgent Care

## 2023-02-28 ENCOUNTER — Inpatient Hospital Stay
Admission: RE | Admit: 2023-02-28 | Discharge: 2023-03-04 | DRG: 455 | Disposition: A | Discharge status: Home / Self Care | Payer: Medicare Other | Attending: Neurosurgery | Admitting: Neurosurgery

## 2023-02-28 ENCOUNTER — Encounter: Payer: Self-pay | Admitting: Neurosurgery

## 2023-02-28 ENCOUNTER — Encounter
Admission: RE | Disposition: A | Discharge status: Home / Self Care | Payer: Self-pay | Source: Home / Self Care | Attending: Neurosurgery

## 2023-02-28 DIAGNOSIS — K5900 Constipation, unspecified: Secondary | ICD-10-CM | POA: Diagnosis present

## 2023-02-28 DIAGNOSIS — N183 Chronic kidney disease, stage 3 unspecified: Secondary | ICD-10-CM | POA: Diagnosis present

## 2023-02-28 DIAGNOSIS — Z79890 Hormone replacement therapy (postmenopausal): Secondary | ICD-10-CM

## 2023-02-28 DIAGNOSIS — E7800 Pure hypercholesterolemia, unspecified: Secondary | ICD-10-CM | POA: Diagnosis present

## 2023-02-28 DIAGNOSIS — M5127 Other intervertebral disc displacement, lumbosacral region: Secondary | ICD-10-CM | POA: Diagnosis present

## 2023-02-28 DIAGNOSIS — M5136 Other intervertebral disc degeneration, lumbar region: Secondary | ICD-10-CM | POA: Diagnosis not present

## 2023-02-28 DIAGNOSIS — M4317 Spondylolisthesis, lumbosacral region: Secondary | ICD-10-CM | POA: Diagnosis present

## 2023-02-28 DIAGNOSIS — K219 Gastro-esophageal reflux disease without esophagitis: Secondary | ICD-10-CM | POA: Diagnosis present

## 2023-02-28 DIAGNOSIS — E039 Hypothyroidism, unspecified: Secondary | ICD-10-CM | POA: Diagnosis present

## 2023-02-28 DIAGNOSIS — I129 Hypertensive chronic kidney disease with stage 1 through stage 4 chronic kidney disease, or unspecified chronic kidney disease: Secondary | ICD-10-CM | POA: Diagnosis present

## 2023-02-28 DIAGNOSIS — M4316 Spondylolisthesis, lumbar region: Secondary | ICD-10-CM | POA: Diagnosis present

## 2023-02-28 DIAGNOSIS — Z79899 Other long term (current) drug therapy: Secondary | ICD-10-CM | POA: Diagnosis not present

## 2023-02-28 DIAGNOSIS — Z87891 Personal history of nicotine dependence: Secondary | ICD-10-CM

## 2023-02-28 DIAGNOSIS — R112 Nausea with vomiting, unspecified: Secondary | ICD-10-CM | POA: Diagnosis not present

## 2023-02-28 DIAGNOSIS — Z01812 Encounter for preprocedural laboratory examination: Principal | ICD-10-CM

## 2023-02-28 DIAGNOSIS — Z01818 Encounter for other preprocedural examination: Secondary | ICD-10-CM

## 2023-02-28 DIAGNOSIS — G8929 Other chronic pain: Secondary | ICD-10-CM | POA: Diagnosis present

## 2023-02-28 DIAGNOSIS — Z85828 Personal history of other malignant neoplasm of skin: Secondary | ICD-10-CM

## 2023-02-28 HISTORY — PX: TRANSFORAMINAL LUMBAR INTERBODY FUSION (TLIF) WITH PEDICLE SCREW FIXATION 1 LEVEL: SHX6141

## 2023-02-28 HISTORY — PX: APPLICATION OF INTRAOPERATIVE CT SCAN: SHX6668

## 2023-02-28 LAB — ABO/RH: ABO/RH(D): O POS

## 2023-02-28 LAB — TYPE AND SCREEN
ABO/RH(D): O POS
Antibody Screen: NEGATIVE

## 2023-02-28 SURGERY — POSTERIOR LUMBAR FUSION 3 LEVEL
Anesthesia: General | Site: Spine Lumbar

## 2023-02-28 MED ORDER — KETOROLAC TROMETHAMINE 0.5 % OP SOLN
1.0000 [drp] | Freq: Four times a day (QID) | OPHTHALMIC | Status: DC
  Administered 2023-02-28: 1 [drp] via OPHTHALMIC
  Filled 2023-02-28: qty 3

## 2023-02-28 MED ORDER — ACETAMINOPHEN 650 MG RE SUPP: 650.0000 mg | RECTAL | Status: DC | PRN

## 2023-02-28 MED ORDER — LEVOTHYROXINE SODIUM 50 MCG PO TABS
75.0000 ug | ORAL_TABLET | Freq: Every day | ORAL | Status: DC
  Administered 2023-03-01 – 2023-03-04 (×4): 75 ug via ORAL
  Filled 2023-02-28 (×2): qty 1
  Filled 2023-02-28 (×4): qty 3
  Filled 2023-02-28 (×2): qty 1

## 2023-02-28 MED ORDER — ONDANSETRON HCL 4 MG/2ML IJ SOLN
INTRAMUSCULAR | Status: DC | PRN
  Administered 2023-02-28 (×2): 4 mg via INTRAVENOUS

## 2023-02-28 MED ORDER — ACETAMINOPHEN 325 MG PO TABS
650.0000 mg | ORAL_TABLET | ORAL | Status: DC | PRN
  Administered 2023-03-01 – 2023-03-04 (×6): 650 mg via ORAL
  Filled 2023-02-28 (×6): qty 2

## 2023-02-28 MED ORDER — VANCOMYCIN HCL IN DEXTROSE 1-5 GM/200ML-% IV SOLN
INTRAVENOUS | Status: AC
  Filled 2023-02-28: qty 200

## 2023-02-28 MED ORDER — ORAL CARE MOUTH RINSE: 15.0000 mL | Freq: Once | OROMUCOSAL | Status: AC

## 2023-02-28 MED ORDER — REMIFENTANIL HCL 1 MG IV SOLR
INTRAVENOUS | Status: AC
  Filled 2023-02-28: qty 1000

## 2023-02-28 MED ORDER — BUPIVACAINE-EPINEPHRINE (PF) 0.5% -1:200000 IJ SOLN
INTRAMUSCULAR | Status: AC
  Filled 2023-02-28: qty 30

## 2023-02-28 MED ORDER — ZOLPIDEM TARTRATE 5 MG PO TABS
5.0000 mg | ORAL_TABLET | Freq: Every evening | ORAL | Status: DC | PRN
  Filled 2023-02-28: qty 1

## 2023-02-28 MED ORDER — PROPOFOL 10 MG/ML IV BOLUS
INTRAVENOUS | Status: DC | PRN
  Administered 2023-02-28: 180 mg via INTRAVENOUS
  Administered 2023-02-28: 20 mg via INTRAVENOUS
  Administered 2023-02-28: 50 mg via INTRAVENOUS

## 2023-02-28 MED ORDER — FENTANYL CITRATE (PF) 100 MCG/2ML IJ SOLN
INTRAMUSCULAR | Status: AC
  Filled 2023-02-28: qty 2

## 2023-02-28 MED ORDER — CHLORHEXIDINE GLUCONATE 0.12 % MT SOLN
OROMUCOSAL | Status: AC
  Filled 2023-02-28: qty 15

## 2023-02-28 MED ORDER — SUCCINYLCHOLINE CHLORIDE 200 MG/10ML IV SOSY
PREFILLED_SYRINGE | INTRAVENOUS | Status: DC | PRN
  Administered 2023-02-28: 100 mg via INTRAVENOUS

## 2023-02-28 MED ORDER — PROPOFOL 1000 MG/100ML IV EMUL
INTRAVENOUS | Status: AC
  Filled 2023-02-28: qty 100

## 2023-02-28 MED ORDER — OXYCODONE HCL 5 MG PO TABS
5.0000 mg | ORAL_TABLET | Freq: Once | ORAL | Status: AC | PRN
  Administered 2023-02-28: 5 mg via ORAL

## 2023-02-28 MED ORDER — HYDROMORPHONE HCL 1 MG/ML IJ SOLN
INTRAMUSCULAR | Status: AC
  Filled 2023-02-28: qty 1

## 2023-02-28 MED ORDER — LACTATED RINGERS IV SOLN: INTRAVENOUS | Status: DC

## 2023-02-28 MED ORDER — BUPIVACAINE-EPINEPHRINE (PF) 0.5% -1:200000 IJ SOLN
INTRAMUSCULAR | Status: DC | PRN
  Administered 2023-02-28: 6.5 mL

## 2023-02-28 MED ORDER — FENTANYL CITRATE (PF) 100 MCG/2ML IJ SOLN
25.0000 ug | INTRAMUSCULAR | Status: DC | PRN
  Administered 2023-02-28 (×2): 25 ug via INTRAVENOUS
  Administered 2023-02-28: 50 ug via INTRAVENOUS
  Administered 2023-02-28: 25 ug via INTRAVENOUS

## 2023-02-28 MED ORDER — ACETAMINOPHEN 10 MG/ML IV SOLN
INTRAVENOUS | Status: DC | PRN
  Administered 2023-02-28: 1000 mg via INTRAVENOUS

## 2023-02-28 MED ORDER — BUPIVACAINE HCL (PF) 0.5 % IJ SOLN
INTRAMUSCULAR | Status: AC
  Filled 2023-02-28: qty 30

## 2023-02-28 MED ORDER — OXYCODONE HCL 5 MG PO TABS
ORAL_TABLET | ORAL | Status: AC
  Filled 2023-02-28: qty 1

## 2023-02-28 MED ORDER — MENTHOL 3 MG MT LOZG: 1.0000 | LOZENGE | OROMUCOSAL | Status: DC | PRN

## 2023-02-28 MED ORDER — SENNA 8.6 MG PO TABS
1.0000 | ORAL_TABLET | Freq: Two times a day (BID) | ORAL | Status: DC
  Administered 2023-02-28 – 2023-03-03 (×7): 8.6 mg via ORAL
  Filled 2023-02-28 (×8): qty 1

## 2023-02-28 MED ORDER — DEXAMETHASONE SODIUM PHOSPHATE 10 MG/ML IJ SOLN
INTRAMUSCULAR | Status: DC | PRN
  Administered 2023-02-28: 10 mg via INTRAVENOUS

## 2023-02-28 MED ORDER — HYDROMORPHONE HCL 1 MG/ML IJ SOLN
INTRAMUSCULAR | Status: DC | PRN
  Administered 2023-02-28: 1 mg via INTRAVENOUS

## 2023-02-28 MED ORDER — VANCOMYCIN HCL 1000 MG IV SOLR
INTRAVENOUS | Status: DC | PRN
  Administered 2023-02-28: 1000 mg

## 2023-02-28 MED ORDER — 0.9 % SODIUM CHLORIDE (POUR BTL) OPTIME
TOPICAL | Status: DC | PRN
  Administered 2023-02-28: 500 mL

## 2023-02-28 MED ORDER — OXYCODONE HCL 5 MG PO TABS
10.0000 mg | ORAL_TABLET | ORAL | Status: DC | PRN
  Administered 2023-02-28: 10 mg via ORAL
  Filled 2023-02-28: qty 2

## 2023-02-28 MED ORDER — CELECOXIB 200 MG PO CAPS: 200.0000 mg | ORAL_CAPSULE | Freq: Two times a day (BID) | ORAL | Status: DC

## 2023-02-28 MED ORDER — FAMOTIDINE 20 MG PO TABS
ORAL_TABLET | ORAL | Status: AC
  Filled 2023-02-28: qty 1

## 2023-02-28 MED ORDER — PHENOL 1.4 % MT LIQD: 1.0000 | OROMUCOSAL | Status: DC | PRN

## 2023-02-28 MED ORDER — CHLORHEXIDINE GLUCONATE 0.12 % MT SOLN
15.0000 mL | Freq: Once | OROMUCOSAL | Status: AC
  Administered 2023-02-28: 15 mL via OROMUCOSAL

## 2023-02-28 MED ORDER — BUPIVACAINE LIPOSOME 1.3 % IJ SUSP
INTRAMUSCULAR | Status: AC
  Filled 2023-02-28: qty 20

## 2023-02-28 MED ORDER — LIDOCAINE HCL (CARDIAC) PF 100 MG/5ML IV SOSY
PREFILLED_SYRINGE | INTRAVENOUS | Status: DC | PRN
  Administered 2023-02-28: 100 mg via INTRAVENOUS

## 2023-02-28 MED ORDER — ONDANSETRON HCL 4 MG/2ML IJ SOLN
4.0000 mg | Freq: Four times a day (QID) | INTRAMUSCULAR | Status: DC | PRN
  Administered 2023-03-02 – 2023-03-03 (×2): 4 mg via INTRAVENOUS
  Filled 2023-02-28 (×2): qty 2

## 2023-02-28 MED ORDER — PHENYLEPHRINE HCL-NACL 20-0.9 MG/250ML-% IV SOLN
INTRAVENOUS | Status: DC | PRN
  Administered 2023-02-28: 25 ug/min via INTRAVENOUS

## 2023-02-28 MED ORDER — KETOROLAC TROMETHAMINE 15 MG/ML IJ SOLN
INTRAMUSCULAR | Status: AC
  Filled 2023-02-28: qty 1

## 2023-02-28 MED ORDER — EPHEDRINE SULFATE (PRESSORS) 50 MG/ML IJ SOLN
INTRAMUSCULAR | Status: DC | PRN
  Administered 2023-02-28 (×2): 5 mg via INTRAVENOUS

## 2023-02-28 MED ORDER — SODIUM CHLORIDE (PF) 0.9 % IJ SOLN
INTRAMUSCULAR | Status: DC | PRN
  Administered 2023-02-28: 60 mL via INTRAMUSCULAR

## 2023-02-28 MED ORDER — CEFAZOLIN SODIUM-DEXTROSE 2-4 GM/100ML-% IV SOLN
2.0000 g | Freq: Once | INTRAVENOUS | Status: AC
  Administered 2023-02-28 (×2): 2 g via INTRAVENOUS

## 2023-02-28 MED ORDER — REMIFENTANIL HCL 2 MG IV SOLR
INTRAVENOUS | Status: DC | PRN
  Administered 2023-02-28: .1 ug/kg/min via INTRAVENOUS

## 2023-02-28 MED ORDER — OXYCODONE HCL 5 MG/5ML PO SOLN: 5.0000 mg | Freq: Once | ORAL | Status: AC | PRN

## 2023-02-28 MED ORDER — SURGIFLO WITH THROMBIN (HEMOSTATIC MATRIX KIT) OPTIME
TOPICAL | Status: DC | PRN
  Administered 2023-02-28: 1 via TOPICAL

## 2023-02-28 MED ORDER — KETOROLAC TROMETHAMINE 15 MG/ML IJ SOLN
7.5000 mg | Freq: Four times a day (QID) | INTRAMUSCULAR | Status: AC
  Administered 2023-02-28 – 2023-03-01 (×4): 7.5 mg via INTRAVENOUS
  Filled 2023-02-28 (×3): qty 1

## 2023-02-28 MED ORDER — SODIUM CHLORIDE 0.9% FLUSH: 3.0000 mL | INTRAVENOUS | Status: DC | PRN

## 2023-02-28 MED ORDER — METHOCARBAMOL 500 MG PO TABS
500.0000 mg | ORAL_TABLET | Freq: Four times a day (QID) | ORAL | Status: DC | PRN
  Administered 2023-02-28 – 2023-03-02 (×5): 500 mg via ORAL
  Filled 2023-02-28 (×5): qty 1

## 2023-02-28 MED ORDER — GLYCOPYRROLATE 0.2 MG/ML IJ SOLN
INTRAMUSCULAR | Status: DC | PRN
  Administered 2023-02-28: .2 mg via INTRAVENOUS

## 2023-02-28 MED ORDER — METHOCARBAMOL 1000 MG/10ML IJ SOLN
500.0000 mg | Freq: Four times a day (QID) | INTRAVENOUS | Status: DC | PRN
  Filled 2023-02-28: qty 5

## 2023-02-28 MED ORDER — MORPHINE SULFATE (PF) 2 MG/ML IV SOLN
2.0000 mg | INTRAVENOUS | Status: DC | PRN
  Administered 2023-02-28 (×2): 2 mg via INTRAVENOUS
  Filled 2023-02-28 (×2): qty 1

## 2023-02-28 MED ORDER — VANCOMYCIN HCL 1000 MG IV SOLR
INTRAVENOUS | Status: AC
  Filled 2023-02-28: qty 20

## 2023-02-28 MED ORDER — DEXMEDETOMIDINE HCL IN NACL 80 MCG/20ML IV SOLN
INTRAVENOUS | Status: DC | PRN
  Administered 2023-02-28: 20 ug via BUCCAL

## 2023-02-28 MED ORDER — VANCOMYCIN HCL IN DEXTROSE 1-5 GM/200ML-% IV SOLN
1000.0000 mg | Freq: Once | INTRAVENOUS | Status: AC
  Administered 2023-02-28: 1000 mg via INTRAVENOUS

## 2023-02-28 MED ORDER — PHENYLEPHRINE 80 MCG/ML (10ML) SYRINGE FOR IV PUSH (FOR BLOOD PRESSURE SUPPORT)
PREFILLED_SYRINGE | INTRAVENOUS | Status: DC | PRN
  Administered 2023-02-28: 160 ug via INTRAVENOUS
  Administered 2023-02-28: 80 ug via INTRAVENOUS

## 2023-02-28 MED ORDER — OXYCODONE HCL 5 MG PO TABS
5.0000 mg | ORAL_TABLET | ORAL | Status: DC | PRN
  Administered 2023-03-01: 5 mg via ORAL
  Filled 2023-02-28 (×2): qty 1

## 2023-02-28 MED ORDER — TRIAMTERENE-HCTZ 37.5-25 MG PO TABS
1.0000 | ORAL_TABLET | Freq: Every day | ORAL | Status: DC
  Administered 2023-02-28 – 2023-03-03 (×4): 1 via ORAL
  Filled 2023-02-28 (×5): qty 1

## 2023-02-28 MED ORDER — ENOXAPARIN SODIUM 30 MG/0.3ML IJ SOSY
30.0000 mg | PREFILLED_SYRINGE | INTRAMUSCULAR | Status: DC
  Administered 2023-03-01 – 2023-03-03 (×3): 30 mg via SUBCUTANEOUS
  Filled 2023-02-28 (×3): qty 0.3

## 2023-02-28 MED ORDER — BSS IO SOLN
15.0000 mL | Freq: Once | INTRAOCULAR | Status: DC
  Filled 2023-02-28: qty 15

## 2023-02-28 MED ORDER — SODIUM CHLORIDE 0.9% FLUSH
3.0000 mL | Freq: Two times a day (BID) | INTRAVENOUS | Status: DC
  Administered 2023-02-28 – 2023-03-03 (×7): 3 mL via INTRAVENOUS

## 2023-02-28 MED ORDER — IRRISEPT - 450ML BOTTLE WITH 0.05% CHG IN STERILE WATER, USP 99.95% OPTIME
TOPICAL | Status: DC | PRN
  Administered 2023-02-28: 450 mL

## 2023-02-28 MED ORDER — FENTANYL CITRATE (PF) 100 MCG/2ML IJ SOLN
INTRAMUSCULAR | Status: DC | PRN
  Administered 2023-02-28: 50 ug via INTRAVENOUS
  Administered 2023-02-28: 100 ug via INTRAVENOUS
  Administered 2023-02-28: 50 ug via INTRAVENOUS

## 2023-02-28 MED ORDER — CEFAZOLIN SODIUM-DEXTROSE 2-4 GM/100ML-% IV SOLN
INTRAVENOUS | Status: AC
  Filled 2023-02-28: qty 100

## 2023-02-28 MED ORDER — SODIUM CHLORIDE 0.9 % IV SOLN: INTRAVENOUS | Status: DC

## 2023-02-28 MED ORDER — SODIUM CHLORIDE FLUSH 0.9 % IV SOLN
INTRAVENOUS | Status: AC
  Filled 2023-02-28: qty 20

## 2023-02-28 MED ORDER — ONDANSETRON HCL 4 MG PO TABS
4.0000 mg | ORAL_TABLET | Freq: Four times a day (QID) | ORAL | Status: DC | PRN
  Administered 2023-03-03: 4 mg via ORAL
  Filled 2023-02-28: qty 1

## 2023-02-28 MED ORDER — SODIUM CHLORIDE 0.9 % IV SOLN: 250.0000 mL | INTRAVENOUS | Status: DC

## 2023-02-28 MED ORDER — FAMOTIDINE 20 MG PO TABS
20.0000 mg | ORAL_TABLET | Freq: Once | ORAL | Status: AC
  Administered 2023-02-28: 20 mg via ORAL

## 2023-02-28 MED ORDER — PHENYLEPHRINE HCL-NACL 20-0.9 MG/250ML-% IV SOLN
INTRAVENOUS | Status: AC
  Filled 2023-02-28: qty 250

## 2023-02-28 MED ORDER — ROSUVASTATIN CALCIUM 10 MG PO TABS
10.0000 mg | ORAL_TABLET | Freq: Every day | ORAL | Status: DC
  Administered 2023-03-01 – 2023-03-03 (×3): 10 mg via ORAL
  Filled 2023-02-28: qty 0.5
  Filled 2023-02-28: qty 1
  Filled 2023-02-28 (×4): qty 0.5
  Filled 2023-02-28 (×3): qty 1

## 2023-02-28 SURGICAL SUPPLY — 84 items
ADH SKN CLS APL DERMABOND .7 (GAUZE/BANDAGES/DRESSINGS) ×2
AGENT HMST KT MTR STRL THRMB (HEMOSTASIS) ×2
ALLOGRAFT BONE FIBER KORE 10CC (Bone Implant) IMPLANT
ALLOGRAFT BONESTRIP KORE 2.5X5 (Bone Implant) IMPLANT
APL PRP STRL LF DISP 70% ISPRP (MISCELLANEOUS)
BASIN KIT SINGLE STR (MISCELLANEOUS) ×2 IMPLANT
BUR NEURO DRILL SOFT 3.0X3.8M (BURR) ×2 IMPLANT
CHLORAPREP W/TINT 26 (MISCELLANEOUS) ×2 IMPLANT
CNTNR URN SCR LID CUP LEK RST (MISCELLANEOUS) ×2 IMPLANT
CONT SPEC 4OZ STRL OR WHT (MISCELLANEOUS) ×2
CORD BIP STRL DISP 12FT (MISCELLANEOUS) IMPLANT
COVERAGE SUPP BRAINLAB NG SPNE (MISCELLANEOUS) IMPLANT
COVERAGE SUPPORT SPINE BRAINLB (MISCELLANEOUS)
CUP MEDICINE 2OZ PLAST GRAD ST (MISCELLANEOUS) ×2 IMPLANT
DERMABOND ADVANCED .7 DNX12 (GAUZE/BANDAGES/DRESSINGS) ×2 IMPLANT
DRAPE 3D C-ARM OEC (DRAPES) IMPLANT
DRAPE C ARM PK CFD 31 SPINE (DRAPES) IMPLANT
DRAPE C-ARMOR (DRAPES) IMPLANT
DRAPE INCISE IOBAN 66X45 STRL (DRAPES) ×2 IMPLANT
DRAPE LAPAROTOMY 100X77 ABD (DRAPES) ×2 IMPLANT
DRAPE MICROSCOPE SPINE 48X150 (DRAPES) IMPLANT
DRAPE SCAN PATIENT (DRAPES) ×2 IMPLANT
DRSG OPSITE POSTOP 4X8 (GAUZE/BANDAGES/DRESSINGS) IMPLANT
DRSG TEGADERM 4X4.75 (GAUZE/BANDAGES/DRESSINGS) IMPLANT
ELECT CAUTERY BLADE TIP 2.5 (TIP) ×2
ELECT EZSTD 165MM 6.5IN (MISCELLANEOUS)
ELECT REM PT RETURN 9FT ADLT (ELECTROSURGICAL) ×2
ELECTRODE CAUTERY BLDE TIP 2.5 (TIP) ×2 IMPLANT
ELECTRODE EZSTD 165MM 6.5IN (MISCELLANEOUS) IMPLANT
ELECTRODE REM PT RTRN 9FT ADLT (ELECTROSURGICAL) ×2 IMPLANT
EX-PIN ORTHOLOCK NAV 4X150 (PIN) IMPLANT
FEE CVG SUPP BRAINLAB NG SPNE (MISCELLANEOUS) IMPLANT
FEE INTRAOP CADWELL SUPPLY NCS (MISCELLANEOUS) IMPLANT
FEE INTRAOP MONITOR IMPULS NCS (MISCELLANEOUS) IMPLANT
GAUZE 4X4 16PLY ~~LOC~~+RFID DBL (SPONGE) ×2 IMPLANT
GAUZE SPONGE 2X2 STRL 8-PLY (GAUZE/BANDAGES/DRESSINGS) ×2 IMPLANT
GLOVE BIOGEL PI IND STRL 6.5 (GLOVE) ×4 IMPLANT
GLOVE SURG SYN 6.5 ES PF (GLOVE) ×4 IMPLANT
GLOVE SURG SYN 6.5 PF PI (GLOVE) ×4 IMPLANT
GLOVE SURG SYN 8.5  E (GLOVE) ×8
GLOVE SURG SYN 8.5 E (GLOVE) ×8 IMPLANT
GLOVE SURG SYN 8.5 PF PI (GLOVE) ×8 IMPLANT
GOWN SRG LRG LVL 4 IMPRV REINF (GOWNS) ×6 IMPLANT
GOWN SRG XL LVL 3 NONREINFORCE (GOWNS) ×2 IMPLANT
GOWN STRL NON-REIN TWL XL LVL3 (GOWNS) ×2
GOWN STRL REIN LRG LVL4 (GOWNS) ×6
HEMOVAC 400CC 10FR (MISCELLANEOUS) IMPLANT
HOLDER FOLEY CATH W/STRAP (MISCELLANEOUS) IMPLANT
INTERBODY SABLE 10X26 7-14 15D (Miscellaneous) IMPLANT
INTRAOP CADWELL SUPPLY FEE NCS (MISCELLANEOUS)
INTRAOP DISP SUPPLY FEE NCS (MISCELLANEOUS)
INTRAOP MONITOR FEE IMPULS NCS (MISCELLANEOUS)
INTRAOP MONITOR FEE IMPULSE (MISCELLANEOUS)
JET LAVAGE IRRISEPT WOUND (IRRIGATION / IRRIGATOR) ×2
KIT PREVENA INCISION MGT 13 (CANNISTER) IMPLANT
KIT SPINAL PRONEVIEW (KITS) ×2 IMPLANT
KNIFE BAYONET SHORT DISCETOMY (MISCELLANEOUS) IMPLANT
LAVAGE JET IRRISEPT WOUND (IRRIGATION / IRRIGATOR) IMPLANT
MANIFOLD NEPTUNE II (INSTRUMENTS) ×2 IMPLANT
MARKER SKIN DUAL TIP RULER LAB (MISCELLANEOUS) ×2 IMPLANT
MARKER SPHERE PSV REFLC 13MM (MARKER) ×14 IMPLANT
NDL SAFETY ECLIP 18X1.5 (MISCELLANEOUS) ×2 IMPLANT
NS IRRIG 1000ML POUR BTL (IV SOLUTION) ×2 IMPLANT
PACK LAMINECTOMY NEURO (CUSTOM PROCEDURE TRAY) ×2 IMPLANT
PENCIL SMOKE EVACUATOR (MISCELLANEOUS) IMPLANT
ROD RELINE-O 5.5X100 LORD (Rod) IMPLANT
SCREW LOCK RELINE 5.5 TULIP (Screw) IMPLANT
SCREW RELINE-O POLY 6.5X45 (Screw) IMPLANT
SCREW RELINE-O POLY 7.5X45 (Screw) IMPLANT
SOLUTION IRRIG SURGIPHOR (IV SOLUTION) ×2 IMPLANT
STAPLER SKIN PROX 35W (STAPLE) IMPLANT
SURGIFLO W/THROMBIN 8M KIT (HEMOSTASIS) ×2 IMPLANT
SUT DVC VLOC 3-0 CL 6 P-12 (SUTURE) ×2 IMPLANT
SUT ETHILON 3-0 FS-10 30 BLK (SUTURE) ×4
SUT VIC AB 0 CT1 27 (SUTURE) ×6
SUT VIC AB 0 CT1 27XCR 8 STRN (SUTURE) ×2 IMPLANT
SUT VIC AB 2-0 CT1 18 (SUTURE) ×2 IMPLANT
SUTURE EHLN 3-0 FS-10 30 BLK (SUTURE) IMPLANT
SYR 10ML LL (SYRINGE) ×2 IMPLANT
SYR 30ML LL (SYRINGE) ×2 IMPLANT
TOWEL OR 17X26 4PK STRL BLUE (TOWEL DISPOSABLE) ×2 IMPLANT
TRAP FLUID SMOKE EVACUATOR (MISCELLANEOUS) ×2 IMPLANT
TRAY FOLEY SLVR 16FR LF STAT (SET/KITS/TRAYS/PACK) IMPLANT
TROCAR INSERT W/PEDICLE NDL (TROCAR) IMPLANT

## 2023-02-28 NOTE — Anesthesia Preprocedure Evaluation (Addendum)
Anesthesia Evaluation  Patient identified by MRN, date of birth, ID band Patient awake    Reviewed: Allergy & Precautions, NPO status , Patient's Chart, lab work & pertinent test results  History of Anesthesia Complications (+) history of anesthetic complications (aspiration w/ colonoscopy)  Airway Mallampati: III  TM Distance: <3 FB Neck ROM: full    Dental  (+) Chipped, Loose, Caps Loose crown on upper molar:   Pulmonary neg shortness of breath, former smoker   Pulmonary exam normal        Cardiovascular Exercise Tolerance: Good hypertension, (-) angina (-) Past MI Normal cardiovascular exam     Neuro/Psych negative neurological ROS  negative psych ROS   GI/Hepatic Neg liver ROS,GERD  Controlled,,  Endo/Other  Hypothyroidism    Renal/GU Renal disease     Musculoskeletal   Abdominal   Peds  Hematology negative hematology ROS (+)   Anesthesia Other Findings Past Medical History: No date: Anemia No date: Arthritis No date: Cancer     Comment:  basal cell lower leg No date: GERD (gastroesophageal reflux disease) No date: Hypercholesterolemia No date: Hyperparathyroidism No date: Hypertension No date: Hypothyroidism No date: PMB (postmenopausal bleeding) No date: Pneumonia No date: Stage 3 chronic kidney disease No date: Torn rotator cuff     Comment:  bilateral  Past Surgical History: No date: aspiration biopsy procedure No date: breast biopsy (negative) 1987: CESAREAN SECTION 04/30/2022: COLONOSCOPY 03/21/2014: DILATION AND CURETTAGE OF UTERUS No date: FOOT NEUROMA SURGERY; Bilateral 2004: LUMBAR FUSION No date: TUBAL LIGATION     Reproductive/Obstetrics negative OB ROS                             Anesthesia Physical Anesthesia Plan  ASA: 3  Anesthesia Plan: General ETT   Post-op Pain Management:    Induction: Intravenous  PONV Risk Score and Plan: Ondansetron,  Dexamethasone, Midazolam and Treatment may vary due to age or medical condition  Airway Management Planned: Oral ETT  Additional Equipment:   Intra-op Plan:   Post-operative Plan: Extubation in OR  Informed Consent: I have reviewed the patients History and Physical, chart, labs and discussed the procedure including the risks, benefits and alternatives for the proposed anesthesia with the patient or authorized representative who has indicated his/her understanding and acceptance.     Dental Advisory Given  Plan Discussed with: Anesthesiologist, CRNA and Surgeon  Anesthesia Plan Comments: (Patient consented for risks of anesthesia including but not limited to:  - adverse reactions to medications - damage to eyes, teeth, lips or other oral mucosa - nerve damage due to positioning  - sore throat or hoarseness - Damage to heart, brain, nerves, lungs, other parts of body or loss of life  Patient voiced understanding.)       Anesthesia Quick Evaluation

## 2023-02-28 NOTE — H&P (Signed)
Referring Physician:  No referring provider defined for this encounter.  Primary Physician:  Ruthy Dick, DO  History of Present Illness: 02/28/2023 Stephanie Clements presents for intervention with continued symptoms.  01/25/2023 Stephanie Clements presents today to discuss her imaging findings.  Her right leg pain has improved since I last saw her.  Her left leg pain continues.  01/06/2023 Stephanie Clements is here today with a chief complaint of  low back pain that radiates into the posterior right leg that stops at her ankle. She also has numbness in the left leg that is chronic.  She has had intermittent pain down the right greater than left leg for several months.  She has had multiple different exacerbations of pain.  Her pain is made worse by laying down or by walking a significant distance.  She used to walk 1 mile a day and is unable to do so now due to pain.  Changing positions helps.  Her pain can be very severe when it hits her.   Bowel/Bladder Dysfunction: none  Conservative measures:  Physical therapy: currently participating at [facility] since 12/07/22 Multimodal medical therapy including regular antiinflammatories:   norco, valium, decadron IV, flexeril, mobic, tylenol, gabapentin  Injections:  has received epidural steroid injections 01/07/22: Left L5-S1 and Left S1 ESI (Dr. Katrinka Blazing) 11/30/22: Right L5-S1 and Right S1 ESI (Dr. Yves Dill)  Past Surgery:  L3-5 fusion in 2004  Stephanie Clements has no symptoms of cervical myelopathy.  The symptoms are causing a significant impact on the patient's life.    Progress Note from Drake Leach, [place] on 11/09/22:  History of Present Illness: 11/09/2022 [name] has a history of basal cell CA, GERD, HTN, hypothyroidism.    She last had a telephone encounter with Dr. Myer Haff on 02/11/22. She was doing much better with PT and she was to continue her exercises.    History of lumbar fusion L3-L5 in 2004. 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    She is here for follow up.    She has constant LBP with constant posterior right leg pain to her knee that started 4 days ago. She was baking and doing a lot of bending. No left leg pain- she has chronic numbness in this leg. Pain is worse at night, improves when she is able to change positions. She is having trouble sleeping at night due to pain.    She is taking prn norco. She is taking mobic as well.    Bowel/Bladder Dysfunction: none   Previous flare up of pain about a year ago was on left side. This improved with lumbar ESI. She has chronic LBP since her surgery and has chronic numbness in left leg.   I have utilized the care everywhere function in epic to review the outside records available from external health systems.  Review of Systems:  A 10 point review of systems is negative, except for the pertinent positives and negatives detailed in the HPI.  Past Medical History: Past Medical History:  Diagnosis Date   Anemia    Arthritis    Cancer    basal cell lower leg   GERD (gastroesophageal reflux disease)    Hypercholesterolemia    Hyperparathyroidism    Hypertension    Hypothyroidism    PMB (postmenopausal bleeding)    Pneumonia  Stage 3 chronic kidney disease    Torn rotator cuff    bilateral    Past Surgical History: Past Surgical History:  Procedure Laterality Date   aspiration biopsy procedure     breast biopsy (negative)     CESAREAN SECTION  1987   COLONOSCOPY  04/30/2022   DILATION AND CURETTAGE OF UTERUS  03/21/2014   FOOT NEUROMA SURGERY Bilateral    LUMBAR FUSION  2004   TUBAL LIGATION      Allergies: Allergies as of 02/03/2023 - Review Complete 01/06/2023  Allergen Reaction Noted   Pollen extract Itching 10/05/2018   Gabapentin Other (See Comments) 09/19/2018    Cyclobenzaprine Other (See Comments) 09/24/2019   Poison ivy extract Itching 07/29/2015   Poison oak extract Itching 09/08/2017    Medications: Current Meds  Medication Sig   acetaminophen (TYLENOL) 325 MG tablet Take 650 mg by mouth every 6 (six) hours as needed.   HYDROcodone-acetaminophen (NORCO/VICODIN) 5-325 MG tablet Take 1 tablet by mouth every 6 (six) hours as needed.   levothyroxine (SYNTHROID) 75 MCG tablet Take 75 mcg by mouth daily before breakfast.   loratadine (CLARITIN) 10 MG tablet Take 10 mg by mouth daily.   Magnesium Oxide -Mg Supplement (MAG-OXIDE) 200 MG TABS Take 2 tablets by mouth daily.   meloxicam (MOBIC) 7.5 MG tablet Take 15 mg by mouth daily.   rosuvastatin (CRESTOR) 10 MG tablet Take 1 tablet by mouth daily.    Social History: Social History   Tobacco Use   Smoking status: Former    Types: Cigarettes   Smokeless tobacco: Never  Vaping Use   Vaping Use: Never used  Substance Use Topics   Alcohol use: Not Currently   Drug use: Not Currently    Family Medical History: History reviewed. No pertinent family history.  Physical Examination: Vitals:   02/28/23 0624  BP: (!) 165/80  Pulse: 79  Resp: 18  Temp: (!) 97 F (36.1 C)  SpO2: 97%   Heart sounds normal no MRG. Chest Clear to Auscultation Bilaterally.  5/5 throughout BLE   Medical Decision Making  Imaging: MRI L spine 12/11/2021 IMPRESSION:  1. Prior posterior spinal fusion and laminectomies from L3 through L5.  2.  Worsening adjacent segment disease at the L5-S1 level with increased  narrowing of the left greater than right lateral recesses, which may affect  the descending bilateral S1 nerve roots.  3.  Additional degenerative changes as described above.    Electronically Signed by:  Charlotte Sanes, MD, [facility]  Electronically Signed on:  12/11/2021 8:35 PM   MRI L spine 01/17/2023 FINDINGS:   Conus terminates at T12. Fusion hardware L3-L5.   L1-L2:  Unremarkable   L2-L3: Severe degenerative disc disease. There is a disc  osteophyte complex with moderate bilateral degenerative facet  disease causing mild spinal canal narrowing. Mild bilateral  foraminal narrowing   L3-L4: Right laminectomy. No mass effect on the thecal sac or  foraminal stenosis   L4-L5: No mass effect on the thecal sac or foraminal narrowing   L5-S1: Severe degenerative disc disease. There is a disc  osteophyte complex and a left paracentral disc protrusion causing  mass effect upon the left S1 nerve root origin in the lateral  recess. Severe bilateral degenerative facet disease. Overall  moderate spinal canal narrowing. Mild right foraminal stenosis.   Paraspinal soft tissues are unremarkable   I have personally reviewed the images and agree with the above interpretation.  Assessment and Plan: Stephanie Clements is  a pleasant 72 y.o. 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  She has tried and failed conservative management.  At this point, I recommended L3-S1 intervention with L5-S1 transforaminal lumbar interbody fusion from the left.         Stephanie Clements K. Myer HaffYarbrough MD, [facility]

## 2023-02-28 NOTE — Transfer of Care (Signed)
Immediate Anesthesia Transfer of Care Note  Patient: Stephanie Clements  Procedure(s) Performed: L3-S1 POSTERIOR SPINAL FUSION (Spine Lumbar) OPEN L5-S1 TRANSFORAMINAL LUMBAR INTERBODY FUSION (TLIF) (Spine Lumbar) APPLICATION OF INTRAOPERATIVE CT SCAN  Patient Location: PACU  Anesthesia Type:General  Level of Consciousness: awake, alert , and oriented  Airway & Oxygen Therapy: Patient Spontanous Breathing and Patient connected to face mask oxygen  Post-op Assessment: Report given to RN, Post -op Vital signs reviewed and stable, and Patient moving all extremities  Post vital signs: Reviewed and stable  Last Vitals:  Vitals Value Taken Time  BP 116/70 02/28/23 1130  Temp    Pulse 65 02/28/23 1133  Resp 9 02/28/23 1133  SpO2 96 % 02/28/23 1133  Vitals shown include unvalidated device data.  Last Pain:  Vitals:   02/28/23 0624  TempSrc: Temporal  PainSc: 0-No pain         Complications: No notable events documented.

## 2023-02-28 NOTE — Anesthesia Procedure Notes (Signed)
Procedure Name: Intubation Date/Time: 02/28/2023 7:30 AM  Performed by: Mohammed Kindle, CRNAPre-anesthesia Checklist: Patient identified, Emergency Drugs available, Suction available and Patient being monitored Patient Re-evaluated:Patient Re-evaluated prior to induction Oxygen Delivery Method: Circle system utilized Preoxygenation: Pre-oxygenation with 100% oxygen Induction Type: IV induction and Rapid sequence Ventilation: Mask ventilation without difficulty Laryngoscope Size: McGraph and 3 Grade View: Grade I Tube type: Oral Tube size: 6.5 mm Number of attempts: 1 Airway Equipment and Method: Stylet and Oral airway Placement Confirmation: ETT inserted through vocal cords under direct vision, positive ETCO2, breath sounds checked- equal and bilateral and CO2 detector Secured at: 21 cm Tube secured with: Tape Dental Injury: Teeth and Oropharynx as per pre-operative assessment  Comments: Atraumatic TFH CRNA

## 2023-02-28 NOTE — Anesthesia Postprocedure Evaluation (Signed)
Anesthesia Post Note  Patient: Kinsli Schwager Karp  Procedure(s) Performed: L3-S1 POSTERIOR SPINAL FUSION (Spine Lumbar) OPEN L5-S1 TRANSFORAMINAL LUMBAR INTERBODY FUSION (TLIF) (Spine Lumbar) APPLICATION OF INTRAOPERATIVE CT SCAN  Patient location during evaluation: PACU Anesthesia Type: General Level of consciousness: awake and alert Pain management: pain level controlled Vital Signs Assessment: post-procedure vital signs reviewed and stable Respiratory status: spontaneous breathing, nonlabored ventilation, respiratory function stable and patient connected to nasal cannula oxygen Cardiovascular status: blood pressure returned to baseline and stable Postop Assessment: no apparent nausea or vomiting Anesthetic complications: no   No notable events documented.   Last Vitals:  Vitals:   02/28/23 1200 02/28/23 1253  BP: 122/62 116/60  Pulse: 65 69  Resp: 18 18  Temp: 36.6 C 36.6 C  SpO2: 98% 96%    Last Pain:  Vitals:   02/28/23 1314  TempSrc:   PainSc: 7                  Cleda Mccreedy Mercer Peifer

## 2023-02-28 NOTE — Op Note (Signed)
Indications: Mrs. Lundin presented with: M51.36, M43.16 - Lumbar adjacent segment disease with spondylolisthesis   She failed conservative management prompting surgical intervention.  Findings: fusion L3-5  Preoperative Diagnosis: M51.36, M43.16 - Lumbar adjacent segment disease with spondylolisthesis  Postoperative Diagnosis: same   EBL: 80 ml IVF: 700 ml Drains: 2 placed Disposition: Extubated and Stable to PACU Complications: none  A foley catheter was placed.   Preoperative Note:   Risks of surgery discussed include: infection, bleeding, stroke, coma, death, paralysis, CSF leak, nerve/spinal cord injury, numbness, tingling, weakness, complex regional pain syndrome, recurrent stenosis and/or disc herniation, vascular injury, development of instability, neck/back pain, need for further surgery, persistent symptoms, development of deformity, and the risks of anesthesia. The patient understood these risks and agreed to proceed.  Operative Note:  1. Transforaminal Lumbar Interbody Fusion L5/S1 2. Posterolateral arthrodesis L5 to S1 3. Posterior segmental instrumentation L3 to S1 using Nuvasive Reline 4. Harvesting of autograft via the same incision 5. Placement of a biomechanical device (Globus Sable) at L5/S1 for anterior arthrodesis 6. Use of stereotaxis    The patient was brought to the Operating Room, intubated and turned into the prone position. All pressure points were checked and double checked. Flouroscopy was used to mark the incision. The patient was prepped and draped in the standard fashion. A full timeout was performed. Preoperative antibiotics were given. The incision was injected with local anesthetic.  The incision was opened with a scalpel, then the soft tissues divided with the Bovie. Self-retaining retractors were placed. The paraspinus muscles were reflected laterally in subperiosteal fashion until the transverse processes were visible.  The prior implants  from L3-5 on the right and facet screws at L3-4 and L4-5 on the left were identified and fully exposed.  These were then removed.     The self-retaining retractors were repositioned.  The stereotactic array was placed at L3.  Using the 3D C arm, stereotactic images were acquired and registered to the patient using the Brain[employer] system.  We then utilized stereotactic drill guides to cannulate all pedicles.  Using the stereotactic drill guide, the left-sided L3-S1 pedicles were cannulated to a depth of 40 mm.  The ball-tipped probe was used to check all trajectories.  6.5 x 45 mm screws were then placed on the left side at L3, L4, and L5.  7.5 x 45 mm screw was placed at S1.  On the right side, the L4 and L5 tracts were palpated and did not have any breaches.  7.5 x 45 mm screws were placed at these levels.  The stereotactic drill guide was used to drill a new tract in the lower right L3 pedicle as the prior plate screw had a lateral breach.  6.5 x 45 mm screw was then placed on the right side at L3.  The right S1 pedicle was cannulated and a 7.5 x 45 mm screw placed.   After placement of pedicle screws, a screw-to-screw distractor was placed to distract the disc space. We then turned attention to performing the transforaminal decompression and interbody fusion. The left L5/S1 facet was removed with osteotomes and the drill, and handed off for preparation as autograft. The traversing and exiting nerve roots on the left were identified and protected. The disc was opened using a scalpel. After incising the disc space, we took a combination of pituitary rongeurs, Kerrison rongeurs, disc scrapers, and curettes to remove a majority of the disc material.  We prepared the end plates for accepting the interbody fusion.  We removed the cartilaginous plate, preserved the cortical endplate if possible during this procedure.  We serially dilated up in order to increase the size of the disc space, while protecting the  traversing and exiting nerve roots, until we had sized up to a trial.    The trial was removed, and the disc space packed with autograft and allograft. The Globus Sable TLIF biomechanical device was inserted, then backfilled with a mixture of allograft and autograft, with care taken to protect the nerve roots and thecal sac. After placement of the device, the screw-screw distractor was removed.  The nerve roots were palpated and no compression was noted.  Rods were measured to length, cut, and shaped. The rods were secured using locking caps to manufacturer's specifications from L3-S1. Final images were taken with the 3d C arm to confirm placement of implants. The wound was copiously irrigated, then the external surfaces of the remaining lamina, facet, and transverse processes from L5 to LS1 were decorticated. A mixture of allograft and autograft was placed over the decorticated surfaces for arthrodesis.  A drain was placed subfascially. A 2nd drain was placed after closure of the fascia.  After hemostasis, the wound was closed in layers with 0 and 2-0 vicryl. 3-0 monocryl and a Prevena woundvac  were applied to the incision.  The patient was then flipped supine and extubated with incident. All counts were correct times 2 at the end of the case. No immediate complications were noted.  Drake Leach PA assisted in the entire procedure. An assistant was required for this procedure due to the complexity.  The assistant provided assistance in tissue manipulation and suction, and was required for the successful and safe performance of the procedure. I performed the critical portions of the procedure.   Venetia Night MD

## 2023-02-28 NOTE — Addendum Note (Signed)
Addendum  created 02/28/23 1424 by Mohammed Kindle, CRNA   Flowsheet accepted

## 2023-03-01 ENCOUNTER — Encounter: Payer: Self-pay | Admitting: Neurosurgery

## 2023-03-01 MED ORDER — OXYCODONE HCL 5 MG PO TABS
5.0000 mg | ORAL_TABLET | ORAL | Status: DC | PRN
  Administered 2023-03-01: 10 mg via ORAL
  Administered 2023-03-01 – 2023-03-02 (×2): 5 mg via ORAL
  Administered 2023-03-02: 10 mg via ORAL
  Filled 2023-03-01: qty 2
  Filled 2023-03-01 (×2): qty 1
  Filled 2023-03-01 (×2): qty 2

## 2023-03-01 MED ORDER — CELECOXIB 200 MG PO CAPS
200.0000 mg | ORAL_CAPSULE | Freq: Two times a day (BID) | ORAL | Status: DC
  Administered 2023-03-01 – 2023-03-03 (×5): 200 mg via ORAL
  Filled 2023-03-01 (×6): qty 1

## 2023-03-01 NOTE — Progress Notes (Signed)
    Attending Progress Note  History: Stephanie Clements is here for adjacent segment disease with spondylolisthesis.  POD 1: Stephanie Clements is doing well.  Stephanie Clements was able to ambulate last evening.  Her pain is well-controlled.  Physical Exam: Vitals:   03/01/23 0004 03/01/23 0502  BP: 115/64 106/63  Pulse: 78 80  Resp: 20 20  Temp: 98.3 F (36.8 C) 98.1 F (36.7 C)  SpO2: 96% 96%    AA Ox3 CNI  Strength:5/5 throughout BLE  Wound vac in place  Drains Deep 510 Superficial 70  Data:  Other tests/results: n/a  Assessment/Plan:  Stephanie Clements is doing well after revision lumbar fusion for adjacent segment disease with spondylolisthesis.  - mobilize - pain control - DVT prophylaxis - PTOT - Monitor Drains   Venetia Night MD, [facility] Department of Neurosurgery

## 2023-03-01 NOTE — Progress Notes (Signed)
Orthopedic Tech Progress Note Patient Details:  Hindy Wahlen [facility] 05-Oct-1951 [id number]  Spoke with RN this morning stating she needed a LSO BRACE. So I called in order to [facility] for a LSO BACK BRACE   Patient ID: KISWANA POSTIGLIONE, female   DOB: 1950-12-27, 72 y.o.   MRN: [id number]  Donald Pore 03/01/2023, 8:35 AM

## 2023-03-01 NOTE — Evaluation (Signed)
Occupational Therapy Evaluation Patient Details Name: Stephanie Clements MRN: [id number] DOB: 06/01/1951 Today's Date: 03/01/2023   History of Present Illness Pt admitted for spondylolisthesis of lumbar spine and is s/p L3-S1 fusion on 02/28/23.   Clinical Impression   Patient agreeable to OT evaluation. PTA pt lived with family and was independent for ADLs/IADLs. Pt is grossly Mod I for self-care tasks. Assistance required for LB dressing 2/2 back pain. Pt deferred need for AE (reacher, sock aid). She completed toilet transfer with Mod I and functional mobility to the bathroom with supervision and no AD. Education provided re: no BLT and log roll technique which pt is already familiar with. Pt reports family will be available to provide physical assistance as needed for ADL/IADLs tasks upon discharge. At this time, pt does not demonstrate any acute OT needs. Will complete orders.   Recommendations for follow up therapy are one component of a multi-disciplinary discharge planning process, led by the attending physician.  Recommendations may be updated based on patient status, additional functional criteria and insurance authorization.   Assistance Recommended at Discharge PRN  Patient can return home with the following Assist for transportation;A little help with bathing/dressing/bathroom    Functional Status Assessment  Patient has had a recent decline in their functional status and demonstrates the ability to make significant improvements in function in a reasonable and predictable amount of time.  Equipment Recommendations  None recommended by OT (deferred need for LB dressing AE)    Recommendations for Other Services       Precautions / Restrictions Precautions Precautions: Back Precaution Booklet Issued: No Precaution Comments: multiple wound vacs Required Braces or Orthoses: Spinal Brace Spinal Brace: Applied in sitting position (LSO) Restrictions Weight Bearing Restrictions:  No Other Position/Activity Restrictions: May remove brace when in bed, may ambulate to bathroom without brace, may remove brace to shower      Mobility Bed Mobility Overal bed mobility: Modified Independent             General bed mobility comments: pt already familiar with and has been using log roll technique "for years"    Transfers Overall transfer level: Modified independent Equipment used: None      Balance Overall balance assessment: No apparent balance deficits (not formally assessed)         ADL either performed or assessed with clinical judgement   ADL Overall ADL's : Modified independent           General ADL Comments: Pt grossly Mod I for self-care tasks. Assist for LB dressing 2/2 back pain. Deferred need for AE (reacher, sock aid). Pt completed toilet transfer (regular height toilet) with Mod I using grab bar, sinkside grooming with Mod I, and functional mobility to the bathroom with supervision and no AD.     Vision Baseline Vision/History: 1 Wears glasses (readers only) Patient Visual Report: No change from baseline       Perception     Praxis      Pertinent Vitals/Pain Pain Assessment Pain Assessment: Faces Faces Pain Scale: Hurts a little bit Pain Location: back Pain Descriptors / Indicators: Sore, Aching Pain Intervention(s): Monitored during session, RN gave pain meds during session, Repositioned     Hand Dominance     Extremity/Trunk Assessment Upper Extremity Assessment Upper Extremity Assessment: Overall WFL for tasks assessed   Lower Extremity Assessment Lower Extremity Assessment: Defer to PT evaluation       Communication Communication Communication: No difficulties   Cognition Arousal/Alertness: Awake/alert Behavior During  Therapy: WFL for tasks assessed/performed Overall Cognitive Status: Within Functional Limits for tasks assessed       General Comments       Exercises Other Exercises Other Exercises: OT  provided education re: role of OT, OT POC, post acute recs, sitting up for all meals, EOB/OOB mobility with assistance, home/fall safety, back precautions (no BLT, log roll technique for bed mobility), LB dressing AE   Shoulder Instructions      Home Living Family/patient expects to be discharged to:: Private residence Living Arrangements: Spouse/significant other Available Help at Discharge: Family;Available PRN/intermittently Type of Home: House Home Access: Stairs to enter [employer] of Steps: 3-4 Entrance Stairs-Rails: None Home Layout: One level     Bathroom Shower/Tub: Producer, television/film/video: Handicapped height     Home Equipment: Shower seat - built in          Prior Functioning/Environment Prior Level of Function : Driving;Independent/Modified Independent;Working/employed             Mobility Comments: Independent no AD, denies fall history ADLs Comments: Independent for ADLs/IADLs, working part time, still driving        OT Problem List:        OT Treatment/Interventions:      OT Goals(Current goals can be found in the care plan section) Acute Rehab OT Goals Patient Stated Goal: return home OT Goal Formulation: All assessment and education complete, DC therapy  OT Frequency:      Co-evaluation              AM-PAC OT "6 Clicks" Daily Activity     Outcome Measure Help from another person eating meals?: None Help from another person taking care of personal grooming?: None Help from another person toileting, which includes using toliet, bedpan, or urinal?: None Help from another person bathing (including washing, rinsing, drying)?: A Little Help from another person to put on and taking off regular upper body clothing?: None Help from another person to put on and taking off regular lower body clothing?: A Little 6 Click Score: 22   End of Session Nurse Communication: Mobility status  Activity Tolerance: Patient tolerated  treatment well Patient left: in bed;with call bell/phone within reach;with bed alarm set;with family/visitor present  OT Visit Diagnosis: Other abnormalities of gait and mobility (R26.89);Pain Pain - part of body:  (back)                Time: 9528-4132 OT Time Calculation (min): 20 min Charges:  OT General Charges $OT Visit: 1 Visit OT Evaluation $OT Eval Low Complexity: 1 Low  [facility] MS, OTR/L ascom [phone]  03/01/23, 12:10 PM

## 2023-03-01 NOTE — TOC Initial Note (Signed)
Transition of Care [facility]) - Initial/Assessment Note    Patient Details  Name: Stephanie Clements MRN: [id number] Date of Birth: [birth year]  Transition of Care [facility]) CM/SW Contact:    Garret Reddish, RN Phone Number: 03/01/2023, 4:04 PM  Clinical Narrative:   Chart reviewed. Patient was admitted with Lumbar adjacent segment disease with spondylisthesis. I have meet with patient and her husband at bedside.  Patient reports that prior to admission she lived at home with her husband.  She reports that she was able to bath and dress herself. Patient reports that she has no DME at home.  Patient reports that her PCP is Dr. Francee Piccolo.  [facility] [place], [place].  She reports that medications are affordable.  Patient informs me that she has been up out of bed walking without any assistive devices.  Patient is from [place], [place]. This city is about 2 [place]/2 hours away from [place].  She reports that she would like to go to an Outpatient PT facility in [place].  Patient will provide me the number for the Outpatient PT facility.  Patient plans to return home with her husband on discharge.    TOC will continue to follow for discharge planning.              Expected Discharge Plan:  (Pending Medical work up, hopes to go home with support of husband and possible outpatient PT) Barriers to Discharge: No Barriers Identified   Patient Goals and CMS Choice     Choice offered to / list presented to : Patient (Patient would like to go to an Outpatient PT facility in [place].  Patient to provide the name of the Facility.)      Expected Discharge Plan and Services   Discharge Planning Services: CM Consult   Living arrangements for the past 2 months: Single Family Home                                      Prior Living Arrangements/Services Living arrangements for the past 2 months: Single Family Home Lives with:: Spouse   Do you feel safe going back to the place  where you live?: Yes      Need for Family Participation in Patient Care: Yes (Comment) Care giver support system in place?: Yes (comment) (Patient has a supportive husband) Current home services:  (Patient has no DME at home.)    Activities of Daily Living Home Assistive Devices/Equipment: None ADL Screening (condition at time of admission) Patient's cognitive ability adequate to safely complete daily activities?: Yes Is the patient deaf or have difficulty hearing?: No Does the patient have difficulty seeing, even when wearing glasses/contacts?: No Does the patient have difficulty concentrating, remembering, or making decisions?: No Patient able to express need for assistance with ADLs?: Yes Does the patient have difficulty dressing or bathing?: No Independently performs ADLs?: Yes (appropriate for developmental age) Does the patient have difficulty walking or climbing stairs?: Yes Weakness of Legs: None Weakness of Arms/Hands: None  Permission Sought/Granted   Permission granted to share information with : Yes, Verbal Permission Granted              Emotional Assessment Appearance:: Appears stated age Attitude/Demeanor/Rapport: Engaged Affect (typically observed): Pleasant Orientation: : Oriented to Self, Oriented to Place, Oriented to  Time, Oriented to Situation      Admission diagnosis:  Lumbar adjacent segment disease with spondylolisthesis [  M51.36, M43.16] Patient Active Problem List   Diagnosis Date Noted   Lumbar adjacent segment disease with spondylolisthesis 02/28/2023   PCP:  Ruthy Dick, DO [facility]acy[facility]   [phone] [place], [place] - [address] [place] [address] Phone: [phone] Fax: [phone]  [id number] [place], [place] - [address] [facility] [address] [place] [address] Phone: [phone] Fax: [phone]     Social Determinants of Health (SDOH) Social  History: SDOH Screenings   Food Insecurity: No Food Insecurity (02/28/2023)  Housing: Low Risk  (02/28/2023)  Transportation Needs: No Transportation Needs (02/28/2023)  Utilities: Not At Risk (02/28/2023)  Tobacco Use: Medium Risk (03/01/2023)   SDOH Interventions:     Readmission Risk Interventions     No data to display

## 2023-03-01 NOTE — Evaluation (Signed)
Physical Therapy Evaluation Patient Details Name: Stephanie Clements MRN: [id number] DOB: 1951-03-08 Today's Date: 03/01/2023  History of Present Illness  Pt admitted for spondylolisthesis of lumbar spine and is s/p L3-S1 fusion on 02/28/23.  Clinical Impression  Pt is a pleasant 72 year old female who was admitted for L3-S1 fusion. Pt demonstrates all bed mobility/transfers/ambulation at baseline level. Pt aware of spinal precautions and has been ambulatory multiple laps in hallway. Pt does not require any further PT needs at this time. Pt will be dc in house and does not require follow up. RN aware. Will dc current orders.      Recommendations for follow up therapy are one component of a multi-disciplinary discharge planning process, led by the attending physician.  Recommendations may be updated based on patient status, additional functional criteria and insurance authorization.  Follow Up Recommendations       Assistance Recommended at Discharge PRN  Patient can return home with the following  A little help with walking and/or transfers;Help with stairs or ramp for entrance    Equipment Recommendations None recommended by PT  Recommendations for Other Services       Functional Status Assessment Patient has had a recent decline in their functional status and demonstrates the ability to make significant improvements in function in a reasonable and predictable amount of time.     Precautions / Restrictions Precautions Precautions: Back Precaution Booklet Issued: No Precaution Comments: multiple wound vacs Required Braces or Orthoses: Spinal Brace Spinal Brace: Applied in sitting position (LSO) Restrictions Weight Bearing Restrictions: No Other Position/Activity Restrictions: May remove brace when in bed, may ambulate to bathroom without brace, may remove brace to shower      Mobility  Bed Mobility               General bed mobility comments: not performed as pt received  in recliner    Transfers Overall transfer level: Modified independent Equipment used: None               General transfer comment: safe technique with upright posture and no AD    Ambulation/Gait Ambulation/Gait assistance: Modified independent (Device/Increase time) Gait Distance (Feet): 300 Feet Assistive device: None Gait Pattern/deviations: WFL(Within Functional Limits)       General Gait Details: ambulated with good speed and reciprocal gait pattern. No pain  Stairs Stairs: Yes Stairs assistance: Supervision Stair Management: No rails Number of Stairs: 4 General stair comments: up/down stairs with step to gait pattern and safe technique  Wheelchair Mobility    Modified Rankin (Stroke Patients Only)       Balance Overall balance assessment: No apparent balance deficits (not formally assessed)                                           Pertinent Vitals/Pain Pain Assessment Pain Assessment: No/denies pain    Home Living Family/patient expects to be discharged to:: Private residence Living Arrangements: Spouse/significant other Available Help at Discharge: Family;Available PRN/intermittently Type of Home: House Home Access: Stairs to enter Entrance Stairs-Rails: None Entrance Stairs-Number of Steps: 3-4   Home Layout: One level Home Equipment: Shower seat - built in Additional Comments: per husband can get railings for stairs if needed    Prior Function Prior Level of Function : Driving;Independent/Modified Independent;Working/employed             Mobility Comments: Independent no  AD, denies fall history ADLs Comments: Independent for ADLs/IADLs, working part time, still driving     Hand Dominance        Extremity/Trunk Assessment   Upper Extremity Assessment Upper Extremity Assessment: Overall WFL for tasks assessed    Lower Extremity Assessment Lower Extremity Assessment: Overall WFL for tasks assessed (still  reports lateral numbness on L foot)       Communication   Communication: No difficulties  Cognition Arousal/Alertness: Awake/alert Behavior During Therapy: WFL for tasks assessed/performed Overall Cognitive Status: Within Functional Limits for tasks assessed                                          General Comments      Exercises     Assessment/Plan    PT Assessment All further PT needs can be met in the next venue of care  PT Problem List Decreased balance;Decreased mobility;Pain       PT Treatment Interventions      PT Goals (Current goals can be found in the Care Plan section)  Acute Rehab PT Goals Patient Stated Goal: to go home PT Goal Formulation: All assessment and education complete, DC therapy Time For Goal Achievement: 03/01/23 Potential to Achieve Goals: Good    Frequency       Co-evaluation               AM-PAC PT "6 Clicks" Mobility  Outcome Measure Help needed turning from your back to your side while in a flat bed without using bedrails?: None Help needed moving from lying on your back to sitting on the side of a flat bed without using bedrails?: None Help needed moving to and from a bed to a chair (including a wheelchair)?: None Help needed standing up from a chair using your arms (e.g., wheelchair or bedside chair)?: None Help needed to walk in hospital room?: None Help needed climbing 3-5 steps with a railing? : None 6 Click Score: 24    End of Session   Activity Tolerance: Patient tolerated treatment well Patient left: in chair Nurse Communication: Mobility status PT Visit Diagnosis: Pain;Muscle weakness (generalized) (M62.81) Pain - Right/Left: Left Pain - part of body:  (back)    Time: 5300-5110 PT Time Calculation (min) (ACUTE ONLY): 33 min   Charges:   PT Evaluation $PT Eval Low Complexity: 1 Low PT Treatments $Gait Training: 8-22 mins        Stephanie Clements, PT, DPT,  [facility] [phone]   Stephanie Clements 03/01/2023, 3:46 PM

## 2023-03-01 NOTE — Plan of Care (Signed)

## 2023-03-02 MED ORDER — BISACODYL 10 MG RE SUPP
10.0000 mg | Freq: Every day | RECTAL | Status: DC | PRN
  Administered 2023-03-02: 10 mg via RECTAL
  Filled 2023-03-02: qty 1

## 2023-03-02 MED ORDER — DOCUSATE SODIUM 100 MG PO CAPS
100.0000 mg | ORAL_CAPSULE | Freq: Two times a day (BID) | ORAL | Status: DC
  Administered 2023-03-03 (×2): 100 mg via ORAL
  Filled 2023-03-02 (×3): qty 1

## 2023-03-02 MED ORDER — SORBITOL 70 % SOLN: 960.0000 mL | TOPICAL_OIL | Freq: Once | ORAL | Status: DC | PRN

## 2023-03-02 MED ORDER — LACTULOSE 10 GM/15ML PO SOLN
10.0000 g | Freq: Two times a day (BID) | ORAL | Status: DC | PRN
  Administered 2023-03-02: 10 g via ORAL
  Filled 2023-03-02: qty 30

## 2023-03-03 LAB — CBC
HCT: 32.1 % — ABNORMAL LOW (ref 36.0–46.0)
Hemoglobin: 10.7 g/dL — ABNORMAL LOW (ref 12.0–15.0)
MCH: 29.7 pg (ref 26.0–34.0)
MCHC: 33.3 g/dL (ref 30.0–36.0)
MCV: 89.2 fL (ref 80.0–100.0)
Platelets: 204 10*3/uL (ref 150–400)
RBC: 3.6 MIL/uL — ABNORMAL LOW (ref 3.87–5.11)
RDW: 12.8 % (ref 11.5–15.5)
WBC: 8 10*3/uL (ref 4.0–10.5)
nRBC: 0 % (ref 0.0–0.2)

## 2023-03-03 LAB — CREATININE, SERUM
Creatinine, Ser: 0.94 mg/dL (ref 0.44–1.00)
GFR, Estimated: >60 mL/min (ref >60)

## 2023-03-03 MED ORDER — PROCHLORPERAZINE EDISYLATE 10 MG/2ML IJ SOLN
10.0000 mg | Freq: Four times a day (QID) | INTRAMUSCULAR | Status: DC | PRN
  Administered 2023-03-03 (×2): 10 mg via INTRAVENOUS
  Filled 2023-03-03 (×3): qty 2

## 2023-03-03 MED ORDER — ONDANSETRON HCL 4 MG/2ML IJ SOLN: 4.0000 mg | Freq: Four times a day (QID) | INTRAMUSCULAR | Status: DC | PRN

## 2023-03-03 MED ORDER — ONDANSETRON 4 MG PO TBDP
4.0000 mg | ORAL_TABLET | Freq: Four times a day (QID) | ORAL | Status: DC | PRN
  Filled 2023-03-03: qty 1

## 2023-03-03 NOTE — TOC Progression Note (Signed)
Transition of Care [facility]) - Progression Note    Patient Details  Name: KAMIL MCNEELEY MRN: [id number] Date of Birth: 1951-09-30  Transition of Care [facility]) CM/SW Contact  Garret Reddish, RN Phone Number: 03/03/2023, 12:17 PM  Clinical Narrative:    Chart reviewed.  Noted that PT/OT has signed off on patient.  She has meet goals with PT/OT.  Patient continues to work on pain control, and mobilization.  Patient with Nausea and vomiting over night.  TOC will continue to follow for discharge needs.     Expected Discharge Plan:  (Pending Medical work up, hopes to go home with support of husband and possible outpatient PT) Barriers to Discharge: No Barriers Identified  Expected Discharge Plan and Services   Discharge Planning Services: CM Consult   Living arrangements for the past 2 months: Single Family Home                                       Social Determinants of Health (SDOH) Interventions SDOH Screenings   Food Insecurity: No Food Insecurity (02/28/2023)  Housing: Low Risk  (02/28/2023)  Transportation Needs: No Transportation Needs (02/28/2023)  Utilities: Not At Risk (02/28/2023)  Tobacco Use: Medium Risk (03/01/2023)    Readmission Risk Interventions     No data to display

## 2023-03-03 NOTE — Progress Notes (Signed)
    Attending Progress Note  History: Stephanie Clements is here for adjacent segment disease with spondylolisthesis.   POD2: Doing well. Needs BM POD 1: She is doing well.  She was able to ambulate last evening.  Her pain is well-controlled.  Physical Exam: AA Ox3 CNI  Strength:5/5 throughout BLE  Wound vac in place  Drains superficial contaminated - removed Deep - continue  Data:  Other tests/results: n/a  Assessment/Plan:  Stephanie Clements is doing well after revision lumbar fusion for adjacent segment disease with spondylolisthesis.  - mobilize - pain control - DVT prophylaxis - PTOT - Work on BM today   Venetia Night MD, [facility] Department of Neurosurgery

## 2023-03-03 NOTE — Progress Notes (Signed)
    Attending Progress Note  History: Stephanie Clements is here for adjacent segment disease with spondylolisthesis. POD3: Had nausea and vomiting overnight. +BM POD2: Doing well. Needs BM POD 1: She is doing well.  She was able to ambulate last evening.  Her pain is well-controlled.  Physical Exam: Vitals:   03/02/23 1519 03/03/23 0024  BP: 131/60 (!) 145/68  Pulse: 87 86  Resp: 17 16  Temp: 98 F (36.7 C) 98.2 F (36.8 C)  SpO2: 97% 94%    AA Ox3 CNI  Strength:5/5 throughout BLE  Wound vac in place  Drains deep 25 - removed  Data:  Other tests/results: n/a  Assessment/Plan:  Stephanie Clements is doing well after revision lumbar fusion for adjacent segment disease with spondylolisthesis.  - mobilize - pain control - DVT prophylaxis - PTOT - Work on N/v   Venetia Night MD, [facility] Department of Neurosurgery

## 2023-03-03 NOTE — Care Management Important Message (Signed)
Important Message  Patient Details  Name: Stephanie Clements MRN: [id number] Date of Birth: 1951-07-24   Medicare Important Message Given:  N/A - LOS <3 / Initial given by admissions     Olegario Messier A Jaskirat Schwieger 03/03/2023, 8:47 AM

## 2023-03-04 ENCOUNTER — Encounter: Payer: Self-pay | Admitting: Neurosurgery

## 2023-03-04 MED ORDER — DOCUSATE SODIUM 100 MG PO CAPS: 100.0000 mg | ORAL_CAPSULE | Freq: Two times a day (BID) | ORAL | 0 refills | Status: DC

## 2023-03-04 MED ORDER — ONDANSETRON 4 MG PO TBDP: 4.0000 mg | ORAL_TABLET | Freq: Four times a day (QID) | ORAL | 0 refills | Status: DC | PRN

## 2023-03-04 MED ORDER — SENNA 8.6 MG PO TABS: 1.0000 | ORAL_TABLET | Freq: Two times a day (BID) | ORAL | 0 refills | Status: DC

## 2023-03-04 MED ORDER — METHOCARBAMOL 500 MG PO TABS: 500.0000 mg | ORAL_TABLET | Freq: Four times a day (QID) | ORAL | 0 refills | Status: DC | PRN

## 2023-03-04 MED ORDER — LACTULOSE 10 GM/15ML PO SOLN: 10.0000 g | Freq: Two times a day (BID) | ORAL | 0 refills | Status: DC | PRN

## 2023-03-04 MED ORDER — CELECOXIB 200 MG PO CAPS: 200.0000 mg | ORAL_CAPSULE | Freq: Two times a day (BID) | ORAL | 1 refills | Status: DC

## 2023-03-04 NOTE — Discharge Summary (Signed)
Discharge Summary  Patient ID: Stephanie Clements MRN: [id number] DOB/AGE: Nov 14, 1951 72 y.o.  Admit date: 02/28/2023 Discharge date: 03/04/2023  Admission Diagnoses: M51.36, M43.16 - Lumbar adjacent segment disease with spondylolisthesis   Discharge Diagnoses:  Principal Problem:   Lumbar adjacent segment disease with spondylolisthesis   Discharged Condition: good  Hospital Course:  Stephanie Clements is a 72 year old female presenting with symptomatic lumbar adjacent segment disease and spondylolisthesis.  She underwent an L5-S1 TLIF and L3-S1 PSF. Her intraoperative course was uncomplicated and she was admitted for pain control, drain monitoring, and therapy evaluation.  She was seen by therapy and deemed appropriate for discharge home.  Her drain output was monitored closely and her drains were removed and appropriate.  Her postoperative course was complicated by constipation and nausea with vomiting.  This resolved prior to discharge.  She was discharged with Celebrex, Robaxin, and medications for constipation.  She will resume her home Vicodin prescription if needed.  Consults: None  Significant Diagnostic Studies: none   Treatments: surgery: As above.  Please see separately dictated operative report for further details.  Discharge Exam: Blood pressure (!) 132/42, pulse 80, temperature 98.3 F (36.8 C), resp. rate 18, height 5\' 5"  (1.651 m), weight 88.5 kg, SpO2 96 %.  AA Ox3 CNI   Strength:5/5 throughout BLE   Incision c/d/I and covered with clean post-op bandage  Disposition: Discharge disposition: 01-Home or Self Care       Discharge Instructions     Incentive spirometry RT   Complete by: As directed    Remove dressing in 24 hours   Complete by: As directed       Allergies as of 03/04/2023       Reactions   Pollen Extract Itching   Gabapentin Other (See Comments)   Makes her experience suicidal ideations   Cyclobenzaprine Other (See Comments)   Pt reports  that she hallucinated when taking this medication   Poison Ivy Extract Itching   Other reaction(s): Itching   Poison Oak Extract Itching   Other reaction(s): Itching        Medication List     STOP taking these medications    HYDROcodone-acetaminophen 5-325 MG tablet Commonly known as: NORCO/VICODIN   meloxicam 7.5 MG tablet Commonly known as: MOBIC   zolpidem 5 MG tablet Commonly known as: AMBIEN       TAKE these medications    acetaminophen 325 MG tablet Commonly known as: TYLENOL Take 650 mg by mouth every 6 (six) hours as needed.   celecoxib 200 MG capsule Commonly known as: CELEBREX Take 1 capsule (200 mg total) by mouth 2 (two) times daily.   cyanocobalamin 100 MCG tablet Take 100 mcg by mouth daily.   docusate sodium 100 MG capsule Commonly known as: COLACE Take 1 capsule (100 mg total) by mouth 2 (two) times daily.   lactulose 10 GM/15ML solution Commonly known as: CHRONULAC Take 15 mLs (10 g total) by mouth 2 (two) times daily as needed for moderate constipation.   levothyroxine 75 MCG tablet Commonly known as: SYNTHROID Take 75 mcg by mouth daily before breakfast.   loratadine 10 MG tablet Commonly known as: CLARITIN Take 10 mg by mouth daily.   Mag-Oxide 200 MG Tabs Generic drug: Magnesium Oxide -Mg Supplement Take 2 tablets by mouth daily.   methocarbamol 500 MG tablet Commonly known as: ROBAXIN Take 1 tablet (500 mg total) by mouth every 6 (six) hours as needed for muscle spasms.   ondansetron 4 MG disintegrating  tablet Commonly known as: ZOFRAN-ODT Take 1 tablet (4 mg total) by mouth every 6 (six) hours as needed for nausea or vomiting.   rosuvastatin 10 MG tablet Commonly known as: CRESTOR Take 1 tablet by mouth daily.   senna 8.6 MG Tabs tablet Commonly known as: SENOKOT Take 1 tablet (8.6 mg total) by mouth 2 (two) times daily.   triamterene-hydrochlorothiazide 37.5-25 MG tablet Commonly known as: MAXZIDE-25 Take 1 tablet  by mouth daily.   Vitamin D3 50 MCG (2000 UT) Tabs Generic drug: Cholecalciferol Take 1 tablet by mouth daily.        Follow-up Information     Susanne Borders, PA Follow up on 03/17/2023.   Specialty: Neurosurgery Contact information: [address] [place] [address] [phone]                 Signed: Susanne Borders 03/04/2023, 7:21 AM

## 2023-03-04 NOTE — Discharge Instructions (Signed)
  Your surgeon has performed an operation on your lumbar spine (low back) to relieve pressure on one or more nerves. Many times, patients feel better immediately after surgery and can "overdo it." Even if you feel well, it is important that you follow these activity guidelines. If you do not let your back heal properly from the surgery, you can increase the chance of hardware complications and/or return of your symptoms. The following are instructions to help in your recovery once you have been discharged from the hospital.  Do not use NSAIDs for 3 months after surgery.   Activity    No bending, lifting, or twisting ("BLT"). Avoid lifting objects heavier than 10 pounds (gallon milk jug).  Where possible, avoid household activities that involve lifting, bending, pushing, or pulling such as laundry, vacuuming, grocery shopping, and childcare. Try to arrange for help from friends and family for these activities while your back heals.  Increase physical activity slowly as tolerated.  Taking short walks is encouraged, but avoid strenuous exercise. Do not jog, run, bicycle, lift weights, or participate in any other exercises unless specifically allowed by your doctor. Avoid prolonged sitting, including car rides.  Talk to your doctor before resuming sexual activity.  You should not drive until cleared by your doctor.  Until released by your doctor, you should not return to work or school.  You should rest at home and let your body heal.   You may shower three days after your surgery.  After showering, lightly dab your incision dry. Do not take a tub bath or go swimming for 3 weeks, or until approved by your doctor at your follow-up appointment.  If you smoke, we strongly recommend that you quit.  Smoking has been proven to interfere with normal healing in your back and will dramatically reduce the success rate of your surgery. Please contact [facility] (800-QUIT-NOW) and use the resources at  www.[facility].com for assistance in stopping smoking.  Surgical Incision    Keep your incision area clean and dry.  Your incision was closed with Dermabond glue. The glue should begin to peel away within about a week.  Diet            You may return to your usual diet. Be sure to stay hydrated.  When to Contact us  Although your surgery and recovery will likely be uneventful, you may have some residual numbness, aches, and pains in your back and/or legs. This is normal and should improve in the next few weeks.  However, should you experience any of the following, contact us immediately: New numbness or weakness Pain that is progressively getting worse, and is not relieved by your pain medications or rest Bleeding, redness, swelling, pain, or drainage from surgical incision Chills or flu-like symptoms Fever greater than 101.0 F (38.3 C) Problems with bowel or bladder functions Difficulty breathing or shortness of breath Warmth, tenderness, or swelling in your calf  Contact Information During office hours (Monday-Friday 9 am to 5 pm), please call your physician at [phone] and ask for Sharlot Gowda After hours and weekends, please call [phone] and speak with the neurosurgeon on call For a life-threatening emergency, call 911

## 2023-03-04 NOTE — Progress Notes (Signed)
    Attending Progress Note  History: Stephanie Clements is here for adjacent segment disease with spondylolisthesis.  POD4: Had BM this morning. Nausea and vomiting much improved POD3: Had nausea and vomiting overnight. +BM POD2: Doing well. Needs BM POD 1: She is doing well.  She was able to ambulate last evening.  Her pain is well-controlled.  Physical Exam: Vitals:   03/03/23 1534 03/04/23 0024  BP: (!) 140/66 (!) 132/42  Pulse: 81 80  Resp: 18 18  Temp: 98.2 F (36.8 C) 98.3 F (36.8 C)  SpO2: 94% 96%    AA Ox3 CNI  Strength:5/5 throughout BLE  Incision c/d/I and covered with clean post-op bandage  Data:  Other tests/results: n/a  Assessment/Plan:  Stephanie Clements is doing well after revision lumbar fusion for adjacent segment disease with spondylolisthesis.  - mobilize - pain control - DVT prophylaxis - PTOT  Manning Charity PA-C Department of Neurosurgery

## 2023-03-04 NOTE — Telephone Encounter (Signed)
Duplicate message. 

## 2023-03-07 ENCOUNTER — Encounter: Payer: Self-pay | Admitting: Neurosurgery

## 2023-03-07 NOTE — Telephone Encounter (Signed)
Also advised that she could restart ambien.

## 2023-03-07 NOTE — Telephone Encounter (Signed)
Spoke with patient. Still having nausea and diarrhea. Not eating or drinking much.   She has minimal back pain. Her preop leg pain is gone! Having some pain in both knees.   Advised to stop senna as she is having diarrhea. She is not taking colace or lactulose.   Will stop celebrex as this may be causing GI upset.   She will restart prn hydrocodone (has from prior to surgery and tolerates well) and take prn tylenol. She knows to stay under 3-4 grams of tylenol per day.   If no improvement in GI symptoms by tomorrow, recommend she f/u with PCP or UC.   Will message her tomorrow to check on her.

## 2023-03-17 ENCOUNTER — Ambulatory Visit (INDEPENDENT_AMBULATORY_CARE_PROVIDER_SITE_OTHER): Payer: Medicare Other | Admitting: Neurosurgery

## 2023-03-17 DIAGNOSIS — Z09 Encounter for follow-up examination after completed treatment for conditions other than malignant neoplasm: Secondary | ICD-10-CM

## 2023-03-17 DIAGNOSIS — M5136 Other intervertebral disc degeneration, lumbar region: Secondary | ICD-10-CM

## 2023-03-17 DIAGNOSIS — M4316 Spondylolisthesis, lumbar region: Secondary | ICD-10-CM

## 2023-03-17 NOTE — Progress Notes (Signed)
Neurosurgery Telephone (Audio-Only) Note  Requesting Provider     Ruthy Dick, DO [address] [place],  [place] [address] T: [phone] F: [phone]  Primary Care Provider Ruthy Dick, DO [address] [place] [address] T: [phone] F: [phone]  Telehealth visit was conducted with Stephanie Clements, a 72 y.o. female via telephone.  DOS: 02/28/23 L3-S1 PSF, L5-S1 TLIF   History of Present Illness: Stephanie Clements is a very pleasant 72 year old female who is approximately 2 weeks status post lumbosacral fusion.  She is doing very well and is very pleased with her postoperative recovery thus far.  She does continue to have some buttock pain but denies any radiating leg pain.  She continues to take Tylenol every 6 hours as well as Norco every 6 hours.  She has slowly started to increase her activity and is tolerating this well.  General Review of Systems:  A ROS was performed including pertinent positive and negatives as documented.  All other systems are negative.   Prior to Admission medications   Medication Sig Start Date End Date Taking? Authorizing Provider  acetaminophen (TYLENOL) 325 MG tablet Take 650 mg by mouth every 6 (six) hours as needed.    [provider]  celecoxib (CELEBREX) 200 MG capsule Take 1 capsule (200 mg total) by mouth 2 (two) times daily. 03/04/23   Susanne Borders, PA  Cholecalciferol (VITAMIN D3) 50 MCG (2000 UT) TABS Take 1 tablet by mouth daily.    [provider]  cyanocobalamin 100 MCG tablet Take 100 mcg by mouth daily.    [provider]  docusate sodium (COLACE) 100 MG capsule Take 1 capsule (100 mg total) by mouth 2 (two) times daily. 03/04/23   Susanne Borders, PA  lactulose (CHRONULAC) 10 GM/15ML solution Take 15 mLs (10 g total) by mouth 2 (two) times daily as needed for moderate constipation. 03/04/23   Susanne Borders, PA  levothyroxine (SYNTHROID) 75 MCG tablet Take  75 mcg by mouth daily before breakfast. 09/01/22 09/01/23  [provider]  loratadine (CLARITIN) 10 MG tablet Take 10 mg by mouth daily.    [provider]  Magnesium Oxide -Mg Supplement (MAG-OXIDE) 200 MG TABS Take 2 tablets by mouth daily.    [provider]  methocarbamol (ROBAXIN) 500 MG tablet Take 1 tablet (500 mg total) by mouth every 6 (six) hours as needed for muscle spasms. 03/04/23   Susanne Borders, PA  ondansetron (ZOFRAN-ODT) 4 MG disintegrating tablet Take 1 tablet (4 mg total) by mouth every 6 (six) hours as needed for nausea or vomiting. 03/04/23   Susanne Borders, PA  rosuvastatin (CRESTOR) 10 MG tablet Take 1 tablet by mouth daily. 08/30/22   [provider]  senna (SENOKOT) 8.6 MG TABS tablet Take 1 tablet (8.6 mg total) by mouth 2 (two) times daily. 03/04/23   Susanne Borders, PA  triamterene-hydrochlorothiazide (MAXZIDE-25) 37.5-25 MG tablet Take 1 tablet by mouth daily.    [provider]   Imaging Studies  No interval imaging to review  IMPRESSION  Stephanie Clements is a 72 y.o. female who I performed a telephone encounter today for evaluation and management status post lumbosacral fusion.  PLAN  Stephanie Clements is a very pleasant 72 year old female who is doing well from her recent lumbosacral fusion.  She is without any significant concerns and is weaning off of her pain medication appropriately.  We discussed activity escalation and I encouraged her to continue to  avoid lifting heavier than 10 pounds, bending, pulling, twisting.  Will discuss further escalation at her next visit.  She was encouraged to call the office in the interim should she have any questions or concerns.  She expressed understanding was in agreement with this plan.  No orders of the defined types were placed in this encounter.   DISPOSITION  Follow up: In person appointment in 1 month with Dr. Myer Haff. Pt to have lumbar xrays prior   Susanne Borders,  PA   TELEPHONE DOCUMENTATION   This visit was performed via telephone.  Patient location: home Provider location: office  I spent a total of 10 minutes non-face-to-face activities for this visit on the date of this encounter including review of current clinical condition and response to treatment.  The patient is aware of and accepts the limits of this telehealth visit.

## 2023-04-12 ENCOUNTER — Ambulatory Visit
Admission: RE | Admit: 2023-04-12 | Discharge: 2023-04-12 | Disposition: A | Discharge status: Home / Self Care | Payer: Medicare Other | Source: Ambulatory Visit | Attending: Neurosurgery | Admitting: Neurosurgery

## 2023-04-12 ENCOUNTER — Other Ambulatory Visit: Payer: Self-pay

## 2023-04-12 ENCOUNTER — Ambulatory Visit (INDEPENDENT_AMBULATORY_CARE_PROVIDER_SITE_OTHER): Payer: Medicare Other | Admitting: Neurosurgery

## 2023-04-12 ENCOUNTER — Encounter: Payer: Self-pay | Admitting: Neurosurgery

## 2023-04-12 VITALS — BP 140/78 | Temp 97.9°F | Ht 65.0 in | Wt 195.0 lb

## 2023-04-12 DIAGNOSIS — M4316 Spondylolisthesis, lumbar region: Secondary | ICD-10-CM

## 2023-04-12 DIAGNOSIS — Z09 Encounter for follow-up examination after completed treatment for conditions other than malignant neoplasm: Secondary | ICD-10-CM

## 2023-04-12 DIAGNOSIS — M5136 Other intervertebral disc degeneration, lumbar region: Secondary | ICD-10-CM | POA: Diagnosis present

## 2023-04-12 NOTE — Progress Notes (Signed)
   Neurosurgery Post-op Note  Requesting Provider     Ruthy Dick, DO [address] [place],  [place] [address] T: [phone] F: [phone]  Primary Care Provider Ruthy Dick, DO [address] [place] [address] T: [phone] F: [phone]    DOS: 02/28/23 L3-S1 PSF, L5-S1 TLIF   History of Present Illness: Ms. Gren is a very pleasant 72 year old female who is status post lumbosacral fusion.  She is doing very well.  She has minimal pain.  She has some stiffness when she sleeps.   General Review of Systems:  A ROS was performed including pertinent positive and negatives as documented.  All other systems are negative.  Cranial nerves intact 5-5 strength about bilateral lower extremities Incision clean dry and intact  Imaging Studies  No complications noted  IMPRESSION  Ms. Ariss is a 72 y.o. female status post extension of her fusion.  She is doing extremely well.  I am very pleased with her improvements.  We reviewed her restrictions.  She is now on a 25 pound lifting limit and will slowly begin increasing her activities.  Will see her back in approximately 6 weeks.  If she continues to do well, we will transition that appointment to a telephone visit.   Venetia Night, MD

## 2023-05-16 ENCOUNTER — Encounter: Payer: Self-pay | Admitting: Neurosurgery

## 2023-05-16 DIAGNOSIS — Z981 Arthrodesis status: Secondary | ICD-10-CM

## 2023-05-16 DIAGNOSIS — M4316 Spondylolisthesis, lumbar region: Secondary | ICD-10-CM

## 2023-05-16 NOTE — Telephone Encounter (Signed)
Please change her postop appt with me on Friday to a phone visit.

## 2023-05-17 NOTE — Addendum Note (Signed)
Addended byDrake Leach on: 05/17/2023 11:57 AM   Modules accepted: Orders

## 2023-05-17 NOTE — Telephone Encounter (Signed)
I did external lumbar xray orders for her. These need to be faxed to [facility] at [facility]. Please call them to confirm that fax was received. She is planning to get them done tomorrow.   Thanks!

## 2023-05-17 NOTE — Telephone Encounter (Signed)
Appointment changed. Will she not need xrays after all?

## 2023-05-18 ENCOUNTER — Other Ambulatory Visit: Payer: Self-pay

## 2023-05-18 ENCOUNTER — Inpatient Hospital Stay
Admission: RE | Admit: 2023-05-18 | Discharge: 2023-05-18 | Disposition: A | Discharge status: Home / Self Care | Payer: Self-pay | Source: Ambulatory Visit | Attending: Orthopedic Surgery | Admitting: Orthopedic Surgery

## 2023-05-18 DIAGNOSIS — Z049 Encounter for examination and observation for unspecified reason: Secondary | ICD-10-CM

## 2023-05-18 NOTE — Telephone Encounter (Signed)
Xray order faxed.

## 2023-05-18 NOTE — Telephone Encounter (Signed)
[facility] request sent 

## 2023-05-19 NOTE — Progress Notes (Signed)
   Telephone Visit- Progress Note: Referring Physician:  No referring provider defined for this encounter.  Primary Physician:  Ruthy Dick, DO  This visit was performed via telephone.  Patient location: home Provider location: office  I spent a total of 10 minutes non-face-to-face activities for this visit on the date of this encounter including review of current clinical condition and response to treatment.    Patient has given verbal consent to this telephone visits and we reviewed the limitations of a telephone visit. Patient wishes to proceed.    Chief Complaint:  postop visit  History of Present Illness:  DOS: 02/28/23 L3-S1 PSF, L5-S1 TLIF   HISTORY OF PRESENT ILLNESS: Stephanie Clements is almost 3 months status post L3-S1 PSF, L5-S1 TLIF.   Was doing well at last visit with Dr. Myer Haff.   Phone visit scheduled for postop check.   She is doing well. She has minimal discomfort in her lower back that is worse at night. She has occasional posterior right leg pain. Overall, she is feeling great!   She is taking prn tylenol. She takes 1/2 hydrocodone at night prn.    PHYSICAL EXAMINATION:  No exam as this is a telephone visit.    ROS (Neurologic):  Negative except as noted above   IMAGING: Lumbar xrays dated 05/18/23:  No complications noted.   Findings:  Status post PLIF from L3 to S1. Bilateral dorsal fixation hardware appears intact. Interbody spacer projects within the L5-S1 disc space. There is solid bony fusion across the L3-4 and L4-5 disc spaces. Advanced spondylosis with degenerative loss of disc space height and marginal osteophyte formation at L2-3. Minimal retrolisthesis at this level. Otherwise normal alignment. No lumbar fracture detected. Aortoiliac atherosclerotic vascular calcification noted.   IMPRESSION:  1. PLIF L3-L5. No demonstrated hardware complication..  2. Advanced spondylosis and minimal retrolisthesis at L2-3.  Electronically  Signed by: Mickey Farber on 05/18/2023 12:11 PM   I have personally reviewed the images and agree with the above interpretation.  ASSESSMENT/PLAN:  [name] is doing well s/p above surgery. Treatment options reviewed with patient and following plan made:   - She can slowly increase activity as tolerated. Would still be careful with lifting for next 2-3 weeks and limit it to 25 pounds or less.  - Continue prn hydrocodone from her PCP.  - Follow up with Dr. Myer Haff in 3 months for her 6 months postop. Can do phone visit, but she will need to get xrays prior to that visit.   Advised to contact the office if any questions or concerns arise.  Drake Leach PA-C Department of neurosurgery

## 2023-05-20 ENCOUNTER — Ambulatory Visit (INDEPENDENT_AMBULATORY_CARE_PROVIDER_SITE_OTHER): Payer: Medicare Other | Admitting: Orthopedic Surgery

## 2023-05-20 ENCOUNTER — Encounter: Payer: Self-pay | Admitting: Orthopedic Surgery

## 2023-05-20 DIAGNOSIS — Z09 Encounter for follow-up examination after completed treatment for conditions other than malignant neoplasm: Secondary | ICD-10-CM

## 2023-05-20 DIAGNOSIS — M5136 Other intervertebral disc degeneration, lumbar region: Secondary | ICD-10-CM

## 2023-05-20 DIAGNOSIS — M4316 Spondylolisthesis, lumbar region: Secondary | ICD-10-CM

## 2023-05-20 DIAGNOSIS — Z981 Arthrodesis status: Secondary | ICD-10-CM

## 2023-08-22 ENCOUNTER — Other Ambulatory Visit: Payer: Self-pay

## 2023-08-22 DIAGNOSIS — M51369 Other intervertebral disc degeneration, lumbar region without mention of lumbar back pain or lower extremity pain: Secondary | ICD-10-CM

## 2023-08-23 ENCOUNTER — Ambulatory Visit (INDEPENDENT_AMBULATORY_CARE_PROVIDER_SITE_OTHER): Payer: Medicare Other | Admitting: Neurosurgery

## 2023-08-23 DIAGNOSIS — M51369 Other intervertebral disc degeneration, lumbar region without mention of lumbar back pain or lower extremity pain: Secondary | ICD-10-CM

## 2023-08-23 DIAGNOSIS — G5792 Unspecified mononeuropathy of left lower limb: Secondary | ICD-10-CM | POA: Diagnosis not present

## 2023-08-23 MED ORDER — PREGABALIN 50 MG PO CAPS: 50.0000 mg | ORAL_CAPSULE | Freq: Every day | ORAL | 0 refills | Status: AC

## 2023-08-23 NOTE — Progress Notes (Signed)
   Neurosurgery Post-op Note  Requesting Provider     No referring provider defined for this encounter. T: N/A F:   Primary Care Provider Stephanie Dick, Stephanie Clements [address] [place] [address] T: [phone] F: [phone]  DOS: 02/28/23 L3-S1 PSF, L5-S1 TLIF   History of Present Illness: Stephanie Clements is a very pleasant 72 year old female who is status post lumbosacral fusion.    The surgical pain has gone.  She has noticed some other pains.  She is having some discomfort near the bottom of her incision.  She does not have any limitations.  She has had some returns in sensation in her L foot.  This particularly bothers her at night.  It is burning and uncomfortable. She is using ambien to help sleep but no neuropathic pain meds.  Telephone visit  Imaging Studies  No complications noted  IMPRESSION  Stephanie Clements is a 72 y.o. female status post extension of her fusion.  She is having neuropathic pain in her left foot that has worsened since the nerve was decompressed.  She was suicidal when she previously took gabapentin, so she cannot take gabapentin.  I have suggested a trial of lyrica for what sounds like chronic radiculopathy.  I have asked her to touch base with me in a week or 2 to see how she is doing.  We can consider increasing her dose if she is not getting some relief from this.  We will determine her next follow-up based on her response to Lyrica.  I briefly mentioned spinal cord stimulation if we are unable to get control with a course of different neuropathic pain medications.  For considering this, I would like to refer her to a neurologist local to her who might try a few different options for neuropathic pain.  This visit was performed via telephone.  Patient location: home Provider location: office  I spent a total of 10 minutes non-face-to-face activities for this visit on the date of this encounter including review of current clinical condition and  response to treatment.  The patient is aware of and accepts the limits of this telehealth visit.

## 2023-08-31 ENCOUNTER — Encounter: Payer: Self-pay | Admitting: Neurosurgery

## 2023-08-31 DIAGNOSIS — M51369 Other intervertebral disc degeneration, lumbar region without mention of lumbar back pain or lower extremity pain: Secondary | ICD-10-CM

## 2023-08-31 DIAGNOSIS — Z981 Arthrodesis status: Secondary | ICD-10-CM

## 2023-09-01 NOTE — Addendum Note (Signed)
Addended by: Drake Leach on: 09/01/2023 01:44 PM   Modules accepted: Orders

## 2023-09-01 NOTE — Telephone Encounter (Signed)
DOS: 02/28/23 L3-S1 PSF, L5-S1 TLIF

## 2023-09-05 ENCOUNTER — Encounter: Payer: Self-pay | Admitting: Neurosurgery

## 2023-09-29 ENCOUNTER — Other Ambulatory Visit: Payer: Self-pay | Admitting: Family Medicine

## 2023-09-29 DIAGNOSIS — M51369 Other intervertebral disc degeneration, lumbar region without mention of lumbar back pain or lower extremity pain: Secondary | ICD-10-CM

## 2023-11-28 ENCOUNTER — Encounter: Payer: Self-pay | Admitting: Neurosurgery
